# Patient Record
Sex: Male | Born: 1975 | Race: White | Hispanic: No | Marital: Married | Smoking: Never smoker
Health system: Southern US, Community
[De-identification: ages and names within clinical notes are randomized; demographics above are authoritative.]

## PROBLEM LIST (undated history)

## (undated) DIAGNOSIS — N390 Urinary tract infection, site not specified: Secondary | ICD-10-CM

## (undated) DIAGNOSIS — E785 Hyperlipidemia, unspecified: Secondary | ICD-10-CM

## (undated) HISTORY — PX: VASECTOMY: SHX75

## (undated) HISTORY — DX: Hyperlipidemia, unspecified: E78.5

## (undated) HISTORY — DX: Urinary tract infection, site not specified: N39.0

## (undated) HISTORY — PX: OTHER SURGICAL HISTORY: SHX169

---

## 2014-04-02 ENCOUNTER — Encounter: Payer: Self-pay | Admitting: Sports Medicine

## 2014-04-02 ENCOUNTER — Ambulatory Visit (INDEPENDENT_AMBULATORY_CARE_PROVIDER_SITE_OTHER): Payer: 59 | Admitting: Sports Medicine

## 2014-04-02 VITALS — BP 137/86 | Ht 75.0 in | Wt 190.0 lb

## 2014-04-02 DIAGNOSIS — S86812A Strain of other muscle(s) and tendon(s) at lower leg level, left leg, initial encounter: Secondary | ICD-10-CM

## 2014-04-02 DIAGNOSIS — S86119A Strain of other muscle(s) and tendon(s) of posterior muscle group at lower leg level, unspecified leg, initial encounter: Secondary | ICD-10-CM | POA: Insufficient documentation

## 2014-04-02 DIAGNOSIS — S86112A Strain of other muscle(s) and tendon(s) of posterior muscle group at lower leg level, left leg, initial encounter: Secondary | ICD-10-CM

## 2014-04-02 MED ORDER — NITROGLYCERIN 0.2 MG/HR TD PT24: MEDICATED_PATCH | TRANSDERMAL | Status: DC

## 2014-04-02 NOTE — Patient Instructions (Signed)
Nitroglycerin Protocol   Apply 1/4 nitroglycerin patch to affected area daily.  Change position of patch within the affected area every 24 hours.  You may experience a headache during the first 1-2 weeks of using the patch, these should subside.  If you experience headaches after beginning nitroglycerin patch treatment, you may take your preferred over the counter pain reliever.  Another side effect of the nitroglycerin patch is skin irritation or rash related to patch adhesive.  Please notify our office if you develop more severe headaches or rash, and stop the patch.  Tendon healing with nitroglycerin patch may require 12 to 24 weeks depending on the extent of injury.  Men should not use if taking Viagra, Cialis, or Levitra.   Do not use if you have migraines or rosacea.   -Continue compression sleeve when on feet for prolonged periods and when active -Continue heel lift in your shoe to protect the gastroc -Start double leg (can progress to single leg) heel raises on a step 15 reps x 3 sets, repeat with knees straight and knees bent -Follow-up in 6-8 weeks or sooner if needed

## 2014-04-02 NOTE — Assessment & Plan Note (Signed)
Left medial border of gastrocnemius tear with surrounding fluid -Continue compression sleeve when on feet for prolonged periods and when active -Start NTG protocol -Continue heel lift in your shoe to protect the gastroc -Start double leg (can progress to single leg) heel raises on a step 15 reps x 3 sets, repeat with knees straight and knees bent -Follow-up in 6-8 weeks or sooner if needed

## 2014-04-02 NOTE — Progress Notes (Signed)
   Subjective:    Patient ID: John Waters, male    DOB: Aug 14, 1975, 39 y.o.   MRN: [id number]  HPI John Waters is a 39yo male who presents with left calf pain after an injury while playing tennis on 03/11/14. He was cutting for a ball when he felt a sudden pain in the left calf. Since the injury he has rested and worn a body helix compression sleeve. His symptoms are improving. He denies any swelling, bruising, fever, or chills. He has not prior history of calf injury. 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 He is not taking any medication for pain.  Past medical history, social history, medications, and allergies were reviewed and are up to date in the chart. Review of Systems 7 point review of systems was performed and was otherwise negative unless noted in the history of present illness.    Objective:   Physical Exam BP 137/86 mmHg  Ht 6\' 3"  (1.905 m)  Wt 190 lb (86.183 kg)  BMI 23.75 kg/m2 GEN: The patient is well-developed well-nourished male and in no acute distress.  He is awake alert and oriented x3. SKIN: warm and well-perfused, no rash Neuro: Strength 5/5 globally. Sensation intact throughout. No focal deficits. Vasc: +2 bilateral distal pulses. No edema.  MSK: Grossly normal appearing left calf. Calves symetric. TTP at an area of medial-inferior border of the left gastrocnemius muscle belly. No overlying erythema, induration, or bruising. Achilles tendon intact by palpation. Negative Thompson. Pain reproduced with heel raise. No leg length discrepancy.  Limited MSK [place]: 1.25 x 0.8 area of hypoechoic disruption of medial border of left gastrocnemius muscle with small fluid collection inferiorally at musculotendinous junction. Achilles tendon appears fully intact without thickening.    Assessment & Plan:  Please see problem based assessment and plan in the problem list.

## 2014-05-06 ENCOUNTER — Ambulatory Visit (INDEPENDENT_AMBULATORY_CARE_PROVIDER_SITE_OTHER): Payer: 59 | Admitting: Family Medicine

## 2014-05-06 VITALS — BP 128/80 | HR 76 | Temp 100.5°F | Resp 20 | Ht 75.0 in | Wt 193.1 lb

## 2014-05-06 DIAGNOSIS — B349 Viral infection, unspecified: Secondary | ICD-10-CM

## 2014-05-06 DIAGNOSIS — R6889 Other general symptoms and signs: Secondary | ICD-10-CM

## 2014-05-06 DIAGNOSIS — R509 Fever, unspecified: Secondary | ICD-10-CM

## 2014-05-06 LAB — POCT RAPID STREP A (OFFICE): Rapid Strep A Screen: NEGATIVE

## 2014-05-06 LAB — POCT INFLUENZA A/B
Influenza A, POC: NEGATIVE
Influenza B, POC: NEGATIVE

## 2014-05-06 MED ORDER — ACETAMINOPHEN 325 MG PO TABS: 500.0000 mg | ORAL_TABLET | Freq: Once | ORAL | Status: DC

## 2014-05-06 MED ORDER — OSELTAMIVIR PHOSPHATE 75 MG PO CAPS: 75.0000 mg | ORAL_CAPSULE | Freq: Two times a day (BID) | ORAL | Status: DC

## 2014-05-06 NOTE — Progress Notes (Signed)
Chief Complaint:  Chief Complaint  Patient presents with  . Fever  . Nausea  . Chills  . Generalized Body Aches    HPI: John Waters is a 39 y.o. male who is here for 1 day history of msk aches, nausea, chills and body aches, he has had low grade temp and has been feeling bad all around.  HAs had flu vaccine, some nausea and upset stomach since has not really eaten. He is on 1/2 does of nitro for calf injury possible tear. He has no other complaints. Minimal congestion in the face. No cough.  He has children, one child also has a fever. No rashes, no ear pain, has blown some congestion from his nose. He has had it for 24 hours. Has not taken anything for it, has a diffuse headache, no beck rigidity or pain.   History reviewed. No pertinent past medical history. History reviewed. No pertinent past surgical history. History   Social History  . Marital Status: Single    Spouse Name: N/A    Number of Children: N/A  . Years of Education: N/A   Social History Main Topics  . Smoking status: Never Smoker   . Smokeless tobacco: Never Used  . Alcohol Use: 0.0 oz/week    0 Not specified per week     Waters: rare  . Drug Use: No  . Sexual Activity: None   Other Topics Concern  . None   Social History Narrative  . None   Family History  Problem Relation Age of Onset  . Heart disease Father    No Known Allergies Prior to Admission medications   Medication Sig Start Date End Date Taking? Authorizing Provider  nitroGLYCERIN (NITRODUR - DOSED IN MG/24 HR) 0.1 mg/hr patch Apply 1/4 patch on left calf daily   Yes Historical Provider, MD     ROS: The patient denies  night sweats, unintentional weight loss, chest pain, palpitations, wheezing, dyspnea on exertion, vomiting, abdominal pain, dysuria, hematuria, melena, numbness,  or tingling.   All other systems have been reviewed and were otherwise negative with the exception of those mentioned in the HPI and as above.     PHYSICAL EXAM: Filed Vitals:   05/06/14 1803  BP: 128/80  Pulse: 76  Temp: 100.5 F (38.1 C)  Resp: 20   Filed Vitals:   05/06/14 1803  Height: 6\' 3"  (1.905 m)  Weight: 193 lb 2 oz (87.601 kg)   Body mass index is 24.14 kg/(m^2).  General: Alert, no acute distress HEENT:  Normocephalic, atraumatic, oropharynx patent. EOMI, PERRLA, tonsils nl + TM right side one streak of blood on TM, TM is still intact,  without infection, nontender sinuses, he has some broken cappilaries in the right nasal mucosa.  Cardiovascular:  Regular rate and rhythm, no rubs murmurs or gallops. No pedal edema.  Respiratory: Clear to auscultation bilaterally.  No wheezes, rales, or rhonchi.  No cyanosis, no use of accessory musculature GI: No organomegaly, abdomen is soft and non-tender, positive bowel sounds.  No masses. Skin: No rashes. Neurologic: Facial musculature symmetric. Psychiatric: Patient is appropriate throughout our interaction. Lymphatic: No cervical lymphadenopathy Musculoskeletal: Gait intact.   LABS: Results for orders placed or performed in visit on 05/06/14  POCT Influenza A/B  Result Value Ref Range   Influenza A, POC Negative    Influenza B, POC Negative   POCT rapid strep A  Result Value Ref Range   Rapid Strep A Screen Negative  Negative     EKG/XRAY:   Primary read interpreted by Dr. Marin Waters at [facility].   ASSESSMENT/PLAN: Encounter Diagnoses  Name Primary?  . Fever, unspecified fever cause   . Flu-like symptoms Yes  . 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  Fu prn    Gross sideeffects, risk and benefits, and  alternatives of medications d/w patient. Patient is aware that all medications have potential sideeffects and we are unable to predict every sideeffect or drug-drug interaction that may occur.  LE, Lyons, DO 05/06/2014 6:47 PM

## 2014-05-09 ENCOUNTER — Encounter: Payer: Self-pay | Admitting: Sports Medicine

## 2014-05-14 ENCOUNTER — Ambulatory Visit: Payer: 59 | Admitting: Sports Medicine

## 2014-05-16 ENCOUNTER — Encounter: Payer: Self-pay | Admitting: Sports Medicine

## 2014-05-16 ENCOUNTER — Ambulatory Visit (INDEPENDENT_AMBULATORY_CARE_PROVIDER_SITE_OTHER): Payer: 59 | Admitting: Sports Medicine

## 2014-05-16 VITALS — BP 118/79 | Ht 75.0 in | Wt 190.0 lb

## 2014-05-16 DIAGNOSIS — S86812D Strain of other muscle(s) and tendon(s) at lower leg level, left leg, subsequent encounter: Secondary | ICD-10-CM

## 2014-05-16 DIAGNOSIS — S86112D Strain of other muscle(s) and tendon(s) of posterior muscle group at lower leg level, left leg, subsequent encounter: Secondary | ICD-10-CM

## 2014-05-19 NOTE — Progress Notes (Signed)
  John Waters - 39 y.o. male MRN [id number]  Date of birth: 06-May-1975  SUBJECTIVE: CC: Left calf pain, follow-up HPI: Overall doing better  reporting tolerating activities including a Marathon relay this past weekend with only mild soreness.  He has not returned to tennis  Tolerating nitroglycerin well without difficulty  Wearing a body helix and performing home exercises on an almost daily basis  still guarding his leg and concerned that any explosive movement may reaggravat it  Able to function throughout the day without difficulty  ROS:  denies any numbness or tingling, denies worsening pain out of proportion  HISTORY:  Past Medical, Surgical, Social, and Family History reviewed & updated per EMR.  Pertinent Historical Findings include:  reports that he has never smoked. He has never used smokeless tobacco.  OBJECTIVE:  VS:   HT:6\' 3"  (190.5 cm)   WT:190 lb (86.183 kg)  BMI:23.8          BP:118/79 mmHg  HR: bpm  TEMP: ( )  RESP:   PHYSICAL EXAM:  GENERAL: Adult, well-built, athletic male. No acute distress PSYCH: Alert and appropriately interactive. VASCULAR: No lower extremity pretibial edema  NEURO: Sensation intact to light touch  LEFT LEG: Overall symmetric appearing, no significant deformity, plantar flexion strength 5/5  DATA OBTAINED DURING VISIT: LIMITED MSK ULTRASOUND OF LEFT LEG: Findings:  Large 4 x 5 cm heterogeneous area at the musculotendinous junction of the medial head of the gastroc. Persistent neovascularity with no compressible component. Impression: Resolving medial head of the gastroc tear with complex, resolving hematoma   ASSESSMENT: 1. Gastrocnemius muscle rupture, left, subsequent encounter     PLAN: See problem based charting & AVS for additional documentation.  Continue eccentric Alfredson's exercises  Continue Nitroglycerin protocol  Continue compression sleeve  Discussed would avoid competitive tennis until reevaluated in 6  weeks, okay for social doubles  > Return in about 6 weeks (around 06/27/2014) for repeat ultrasound.

## 2014-07-02 ENCOUNTER — Ambulatory Visit (INDEPENDENT_AMBULATORY_CARE_PROVIDER_SITE_OTHER): Payer: 59 | Admitting: Sports Medicine

## 2014-07-02 ENCOUNTER — Encounter: Payer: Self-pay | Admitting: Sports Medicine

## 2014-07-02 VITALS — BP 132/84 | Ht 75.0 in | Wt 190.0 lb

## 2014-07-02 DIAGNOSIS — S86112D Strain of other muscle(s) and tendon(s) of posterior muscle group at lower leg level, left leg, subsequent encounter: Secondary | ICD-10-CM

## 2014-07-02 DIAGNOSIS — S86812D Strain of other muscle(s) and tendon(s) at lower leg level, left leg, subsequent encounter: Secondary | ICD-10-CM | POA: Diagnosis not present

## 2014-07-02 NOTE — Progress Notes (Signed)
Patient ID: John Waters, male   DOB: Nov 29, 1975, 39 y.o.   MRN: [id number]  He is 3 months after diagnosis of a tear of the left medial gastrocnemius muscle We are using calf compression\ nitroglycerin protocol for which he is somewhat inconsistent/ heel lifts He can do 3 sets of 15 unilateral leg raises Pain is minimal He has returned to most normal activities and some doubles tennis He still feels cautious about sprinting  Exam NAD BP 132/84 mmHg  Ht 6\' 3"  (1.905 m)  Wt 190 lb (86.183 kg)  BMI 23.75 kg/m2  There is no noticeable swelling or discoloration Left medial calf is nontender to palpation Good muscle definition  Walking with no limp  Ultrasound Her previous scan hematoma and swelling covered about 40% of the muscle of the medial gastroc This area appears to have less hypoechoic change and more normal muscle fiber There is only a small area that still looks hypoechoic

## 2014-07-02 NOTE — Assessment & Plan Note (Signed)
Clinically he is at least 90% improved  I would suggest continuing to use calf compression and heel lifts for the next 3 months Keep up home exercises at least 3 times a week  Nitroglycerin can be used up to another 3 months  Recheck if problems recur or he gets swelling or pain

## 2015-01-13 ENCOUNTER — Ambulatory Visit (INDEPENDENT_AMBULATORY_CARE_PROVIDER_SITE_OTHER): Payer: 59 | Admitting: Sports Medicine

## 2015-01-13 ENCOUNTER — Encounter: Payer: Self-pay | Admitting: Sports Medicine

## 2015-01-13 VITALS — BP 133/85 | Ht 75.0 in | Wt 190.0 lb

## 2015-01-13 DIAGNOSIS — G5631 Lesion of radial nerve, right upper limb: Secondary | ICD-10-CM | POA: Diagnosis not present

## 2015-01-13 DIAGNOSIS — M542 Cervicalgia: Secondary | ICD-10-CM | POA: Diagnosis not present

## 2015-01-13 NOTE — Assessment & Plan Note (Signed)
Isolated radial neuropathy causing triceps weakness has been reported in sports but is rare He does not show any signs of metabolic or amyotrophic disease  Prior cases that I have seen or suspected to be a neuropraxia related to bench pressing and possibly a neuropraxia to injury of the brachial plexus  X-ray of the cervical spine to rule out any spurring or congenital anomaly I don't think an MRI would be helpful at this point We will give him vitamin B6 100 per day  Light Weight rehabilitation of the triceps  This is fairly early in the process would not do I nerve conduction or EMG yet  If this is not resolving in a reasonable timeframe we will need to do more evaluation  I will recheck him within one month

## 2015-01-13 NOTE — Progress Notes (Signed)
Patient ID: John Waters, male   DOB: [birth year], 39 y.o.   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 This started about one month ago with some inability to control his forehand in tennis He did not recall a specific injury He noted shortly after that he wasn't able to do as many pushups At the gym he tried some triceps exercises and found that the right was very weak compared to the left  He does recall doing bench presses with fairly heavy weight 1 day and felt this could have aggravated the symptoms He did not have any pain He did not have any numbness over the upper arm No radicular symptoms into the lower arm  no weakness with grip or hand movements  Past medical history is not significant for hypothyroidism or diabetes  Review of systems No recent fever chills or medication use No history of significant viral infection in the past few months No muscle fasciculations  Physical exam Physically fit white male who is muscular and in no acute distress BP 133/85 mmHg  Ht 6\' 3"  (1.905 m)  Wt 190 lb (86.183 kg)  BMI 23.75 kg/m2  Neck examination Full range of motion without pain or radicular symptoms Spurling's test is negative Lateral bend with pressure causes some dull symptoms in the right shoulder Lateral bend to the ipsilateral side causes no pain Triceps reflex on the right is trace to 1+ but is equal to the left Biceps reflexes normal  Strength testing of the right upper extremity; moderate weakness of the right triceps 4/5; biceps and forearm muscles were normal/ interosseous muscles of the hand were strong   RT Shoulder exam  Inspection reveals no abnormalities, atrophy or asymmetry. Palpation is normal with no tenderness over AC joint or bicipital groove. ROM is full in all planes. Rotator cuff strength normal throughout. No signs of impingement with negative Neer and Hawkin's tests, empty can. Speeds and Yergason's  tests normal. No labral pathology noted with negative Obrien's, negative clunk and good stability. Normal scapular function observed. No painful arc and no drop arm sign. No apprehension sign  Upper arm - no sensory change noted  No skin lesions

## 2015-01-21 ENCOUNTER — Ambulatory Visit
Admission: RE | Admit: 2015-01-21 | Discharge: 2015-01-21 | Disposition: A | Discharge status: Home / Self Care | Payer: 59 | Source: Ambulatory Visit | Attending: Sports Medicine | Admitting: Sports Medicine

## 2015-01-21 DIAGNOSIS — M542 Cervicalgia: Secondary | ICD-10-CM

## 2015-04-01 ENCOUNTER — Encounter: Payer: Self-pay | Admitting: Family Medicine

## 2015-04-01 ENCOUNTER — Ambulatory Visit (INDEPENDENT_AMBULATORY_CARE_PROVIDER_SITE_OTHER): Payer: 59 | Admitting: Family Medicine

## 2015-04-01 VITALS — BP 120/80 | Ht 75.0 in | Wt 190.0 lb

## 2015-04-01 DIAGNOSIS — M7711 Lateral epicondylitis, right elbow: Secondary | ICD-10-CM

## 2015-04-01 NOTE — Assessment & Plan Note (Signed)
Right lateral epicondylitis. -Counterforce brace, ice, nitroglycerin patch and eccentric exercises. -Follow-up in 4-8 weeks to see how he is doing. -Can consider PRP/cortisone injection with percutaneous tenotomy if no improvement at that time

## 2015-04-01 NOTE — Progress Notes (Signed)
  John Waters - 40 y.o. male MRN [id number]  Date of birth: 27-Jul-1975 John Waters is a 40 y.o. male who presents today for right elbow pain lateral.  Right elbow pain, initial visit-patient pain is located on the lateral aspect of his elbow. This is been ongoing now for 3-4 weeks after playing extensive amount of tenderness. Pain has subsided somewhat after taking a 2 to three-week hiatus. He tried a counterforce brace for the past week which is also helped. Has not had a previously injected or done exercises this time. No paresthesias going distally into his hand he denies any pain 3-5 cm distal to the lateral epicondyle. Right hand dominant and no previous injury.  PMHx - Updated and reviewed.  Contributory factors include: Noncontributory PSHx - Updated and reviewed.  Contributory factors include:  Noncontributory FHx - Updated and reviewed.  Contributory factors include:  Noncontributory Medications - updated and reviewed.   ROS Per HPI.  12 point negative other than per HPI.   Exam:  Filed Vitals:   04/01/15 1601  BP: 120/80   Gen: NAD, AAO 3 Cardiorespiratory - Normal respiratory effort/rate.  RRR Skin: No rashes or erythema Extremities: No edema, pulses +2 bilateral upper and lower extremity  R elbow - No erythema or increased carrying angle.  TTP lateral epicondyle.  Worse with passive wrist flexion and active wrist extension/dorsiflexion.  ROM nml in all planes. Neurovascularly intact RUE

## 2015-12-07 ENCOUNTER — Other Ambulatory Visit: Payer: Self-pay | Admitting: *Deleted

## 2015-12-07 ENCOUNTER — Encounter (HOSPITAL_COMMUNITY): Payer: Self-pay | Admitting: Emergency Medicine

## 2015-12-07 ENCOUNTER — Ambulatory Visit (HOSPITAL_COMMUNITY)
Admission: RE | Admit: 2015-12-07 | Discharge: 2015-12-07 | Disposition: A | Discharge status: Home / Self Care | Payer: 59 | Source: Ambulatory Visit | Attending: Internal Medicine | Admitting: Internal Medicine

## 2015-12-07 ENCOUNTER — Other Ambulatory Visit (HOSPITAL_COMMUNITY): Payer: Self-pay | Admitting: *Deleted

## 2015-12-07 ENCOUNTER — Emergency Department (HOSPITAL_COMMUNITY)
Admission: EM | Admit: 2015-12-07 | Discharge: 2015-12-07 | Disposition: A | Discharge status: Home / Self Care | Payer: 59 | Attending: Emergency Medicine | Admitting: Emergency Medicine

## 2015-12-07 DIAGNOSIS — R300 Dysuria: Secondary | ICD-10-CM

## 2015-12-07 DIAGNOSIS — N390 Urinary tract infection, site not specified: Secondary | ICD-10-CM | POA: Diagnosis not present

## 2015-12-07 DIAGNOSIS — M545 Low back pain: Secondary | ICD-10-CM | POA: Diagnosis not present

## 2015-12-07 LAB — URINE MICROSCOPIC-ADD ON

## 2015-12-07 LAB — CBC WITH DIFFERENTIAL/PLATELET
BASOS ABS: 0 10*3/uL (ref 0.0–0.1)
Basophils Relative: 0 %
Eosinophils Absolute: 0 10*3/uL (ref 0.0–0.7)
Eosinophils Relative: 0 %
HEMATOCRIT: 44.7 % (ref 39.0–52.0)
Hemoglobin: 15.1 g/dL (ref 13.0–17.0)
LYMPHS ABS: 0.5 10*3/uL — AB (ref 0.7–4.0)
LYMPHS PCT: 3 %
MCH: 29.4 pg (ref 26.0–34.0)
MCHC: 33.8 g/dL (ref 30.0–36.0)
MCV: 87.1 fL (ref 78.0–100.0)
MONO ABS: 1.7 10*3/uL — AB (ref 0.1–1.0)
Monocytes Relative: 10 %
NEUTROS ABS: 14.3 10*3/uL — AB (ref 1.7–7.7)
Neutrophils Relative %: 87 %
Platelets: 203 10*3/uL (ref 150–400)
RBC: 5.13 MIL/uL (ref 4.22–5.81)
RDW: 13.1 % (ref 11.5–15.5)
WBC: 16.7 10*3/uL — AB (ref 4.0–10.5)

## 2015-12-07 LAB — COMPREHENSIVE METABOLIC PANEL
ALT: 20 U/L (ref 17–63)
AST: 30 U/L (ref 15–41)
Albumin: 4.2 g/dL (ref 3.5–5.0)
Alkaline Phosphatase: 55 U/L (ref 38–126)
Anion gap: 13 (ref 5–15)
BILIRUBIN TOTAL: 1.4 mg/dL — AB (ref 0.3–1.2)
BUN: 17 mg/dL (ref 6–20)
CALCIUM: 9.5 mg/dL (ref 8.9–10.3)
CO2: 22 mmol/L (ref 22–32)
CREATININE: 1.19 mg/dL (ref 0.61–1.24)
Chloride: 103 mmol/L (ref 101–111)
GFR calc Af Amer: >60 mL/min (ref >60)
Glucose, Bld: 150 mg/dL — ABNORMAL HIGH (ref 65–99)
Potassium: 3.8 mmol/L (ref 3.5–5.1)
Sodium: 138 mmol/L (ref 135–145)
TOTAL PROTEIN: 7 g/dL (ref 6.5–8.1)

## 2015-12-07 LAB — URINALYSIS, ROUTINE W REFLEX MICROSCOPIC
BILIRUBIN URINE: NEGATIVE
Glucose, UA: NEGATIVE mg/dL
Ketones, ur: NEGATIVE mg/dL
Nitrite: NEGATIVE
PH: 8 (ref 5.0–8.0)
Protein, ur: 30 mg/dL — AB
Specific Gravity, Urine: 1.027 (ref 1.005–1.030)

## 2015-12-07 LAB — POC OCCULT BLOOD, ED: Fecal Occult Bld: NEGATIVE

## 2015-12-07 LAB — LIPASE, BLOOD: LIPASE: 23 U/L (ref 11–51)

## 2015-12-07 MED ORDER — SODIUM CHLORIDE 0.9 % IV BOLUS (SEPSIS)
1000.0000 mL | Freq: Once | INTRAVENOUS | Status: AC
  Administered 2015-12-07: 1000 mL via INTRAVENOUS

## 2015-12-07 MED ORDER — DEXTROSE 5 % IV SOLN
1.0000 g | INTRAVENOUS | Status: DC
  Administered 2015-12-07: 1 g via INTRAVENOUS
  Filled 2015-12-07: qty 10

## 2015-12-07 MED ORDER — ONDANSETRON HCL 8 MG PO TABS: 8.0000 mg | ORAL_TABLET | Freq: Three times a day (TID) | ORAL | 1 refills | Status: DC | PRN

## 2015-12-07 MED ORDER — SULFAMETHOXAZOLE-TRIMETHOPRIM 800-160 MG PO TABS: 1.0000 | ORAL_TABLET | Freq: Two times a day (BID) | ORAL | 1 refills | Status: DC

## 2015-12-07 MED ORDER — ACETAMINOPHEN 500 MG PO TABS
1000.0000 mg | ORAL_TABLET | Freq: Once | ORAL | Status: AC
  Administered 2015-12-07: 1000 mg via ORAL
  Filled 2015-12-07: qty 2

## 2015-12-07 MED ORDER — ONDANSETRON HCL 4 MG/2ML IJ SOLN: 4.0000 mg | Freq: Once | INTRAMUSCULAR | Status: DC

## 2015-12-07 MED FILL — SULFAMETHOXAZOLE/TMP DS TAB: 800-160 | 30 days supply | Qty: 60 | Fill #0

## 2015-12-07 NOTE — ED Triage Notes (Signed)
Pt here with low back pain, dysuria and fevers starting this morning. Pt reports dizziness when standing earlier today. Pt had a UA done at his office today that he reports ws positive.

## 2015-12-07 NOTE — Discharge Instructions (Signed)
Increase fluids. Tylenol for fever. Bactrim twice a day for 14 days. Prescription for Zofran 8 mg. Return if worse.

## 2015-12-07 NOTE — ED Provider Notes (Signed)
[facility] DEPT Provider Note   CSN: [id number] Arrival date & time: 12/07/15  1324     History   Chief Complaint Chief Complaint  Patient presents with  . Fever  . Urinary Frequency  . Back Pain    HPI John Waters is a 40 y.o. male.  Dysuria, low back pain, fever since this morning. He is normally healthy. No history of UTIs. No urological history. Severity of symptoms is moderate. Nothing makes symptoms better or worse.      History reviewed. No pertinent past medical history.  Patient Active Problem List   Diagnosis Date Noted  . Lateral epicondylitis of right elbow 04/01/2015  . 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 Gastrocnemius muscle rupture 04/02/2014    History reviewed. No pertinent surgical history.     Home Medications    Prior to Admission medications   Medication Sig Start Date End Date Taking? Authorizing Provider  acetaminophen (TYLENOL) 500 MG tablet Take 500 mg by mouth every 6 (six) hours as needed for mild pain.   Yes Historical Provider, MD  sulfamethoxazole-trimethoprim (BACTRIM DS) 800-160 MG tablet Take 1 tablet by mouth 2 (two) times daily. With food 12/07/15  Yes Chipper Herb, MD  ondansetron (ZOFRAN) 8 MG tablet Take 1 tablet (8 mg total) by mouth every 8 (eight) hours as needed for nausea or vomiting. 12/07/15   Nat Christen, MD  oseltamivir (TAMIFLU) 75 MG capsule Take 1 capsule (75 mg total) by mouth 2 (two) times daily. Patient not taking: Reported on 12/07/2015 05/06/14   Glenford Bayley, DO    Family History Family History  Problem Relation Age of Onset  . Heart disease Father     Social History Social History  Substance Use Topics  . Smoking status: Never Smoker  . Smokeless tobacco: Never Used  . Alcohol use 0.0 oz/week     Comment: rare     Allergies   Review of patient's allergies indicates no known allergies.   Review of Systems Review of Systems  All other systems reviewed and are  negative.    Physical Exam Updated Vital Signs BP 127/78   Pulse 78   Temp 99.1 F (37.3 C) (Oral)   Resp 18   Ht 6\' 3"  (1.905 m)   Wt 190 lb (86.2 kg)   SpO2 99%   BMI 23.75 kg/m   Physical Exam  Constitutional: He is oriented to person, place, and time. He appears well-developed and well-nourished.  HENT:  Head: Normocephalic and atraumatic.  Eyes: Conjunctivae are normal.  Neck: Neck supple.  Cardiovascular: Normal rate and regular rhythm.   Pulmonary/Chest: Effort normal and breath sounds normal.  Abdominal: Soft. Bowel sounds are normal.  Genitourinary:  Genitourinary Comments: Rectal exam: Prostate nontender. No masses. Heme negative.  Musculoskeletal:  Tender in the lower back.  Neurological: He is alert and oriented to person, place, and time.  Skin: Skin is warm and dry.  Psychiatric: He has a normal mood and affect. His behavior is normal.  Nursing note and vitals reviewed.    ED Treatments / Results  Labs (all labs ordered are listed, but only abnormal results are displayed) Labs Reviewed  CBC WITH DIFFERENTIAL/PLATELET - Abnormal; Notable for the following:       Result Value   WBC 16.7 (*)    Neutro Abs 14.3 (*)    Lymphs Abs 0.5 (*)    Monocytes Absolute 1.7 (*)    All other components within normal limits  COMPREHENSIVE METABOLIC PANEL - Abnormal; Notable for the following:    Glucose, Bld 150 (*)    Total Bilirubin 1.4 (*)    All other components within normal limits  CULTURE, BLOOD (ROUTINE X 2)  CULTURE, BLOOD (ROUTINE X 2)  LIPASE, BLOOD  POC OCCULT BLOOD, ED    EKG  EKG Interpretation None       Radiology No results found.  Procedures Procedures (including critical care time)  Medications Ordered in ED Medications  cefTRIAXone (ROCEPHIN) 1 g in dextrose 5 % 50 mL IVPB (1 g Intravenous New Bag/Given 12/07/15 1517)  ondansetron (ZOFRAN) injection 4 mg (not administered)  sodium chloride 0.9 % bolus 1,000 mL (0 mLs Intravenous  Stopped 12/07/15 1433)  sodium chloride 0.9 % bolus 1,000 mL (0 mLs Intravenous Stopped 12/07/15 1520)  acetaminophen (TYLENOL) tablet 1,000 mg (1,000 mg Oral Given 12/07/15 1358)     Initial Impression / Assessment and Plan / ED Course  I have reviewed the triage vital signs and the nursing notes.  Pertinent labs & imaging results that were available during my care of the patient were reviewed by me and considered in my medical decision making (see chart for details).  Clinical Course    IV fluids, Rocephin 1 g IV, urine culture.  Discharge medications Bactrim DS twice a day and Zofran 8 mg.  Final Clinical Impressions(s) / ED Diagnoses   Final diagnoses:  UTI (lower urinary tract infection)    New Prescriptions New Prescriptions   ONDANSETRON (ZOFRAN) 8 MG TABLET    Take 1 tablet (8 mg total) by mouth every 8 (eight) hours as needed for nausea or vomiting.     Nat Christen, MD 12/07/15 [phone]

## 2015-12-09 LAB — URINE CULTURE: Culture: >=100000 — AB

## 2015-12-11 ENCOUNTER — Other Ambulatory Visit (HOSPITAL_COMMUNITY): Payer: 59

## 2015-12-12 LAB — CULTURE, BLOOD (ROUTINE X 2)
Culture: NO GROWTH
Culture: NO GROWTH

## 2015-12-15 DIAGNOSIS — R300 Dysuria: Secondary | ICD-10-CM | POA: Diagnosis not present

## 2015-12-15 DIAGNOSIS — R350 Frequency of micturition: Secondary | ICD-10-CM | POA: Diagnosis not present

## 2015-12-31 DIAGNOSIS — H52223 Regular astigmatism, bilateral: Secondary | ICD-10-CM | POA: Diagnosis not present

## 2015-12-31 DIAGNOSIS — H5213 Myopia, bilateral: Secondary | ICD-10-CM | POA: Diagnosis not present

## 2016-07-13 ENCOUNTER — Other Ambulatory Visit: Payer: Self-pay | Admitting: Oncology

## 2016-07-13 DIAGNOSIS — N6012 Diffuse cystic mastopathy of left breast: Secondary | ICD-10-CM | POA: Diagnosis not present

## 2016-09-23 ENCOUNTER — Encounter: Payer: Self-pay | Admitting: Family Medicine

## 2016-09-23 ENCOUNTER — Ambulatory Visit (INDEPENDENT_AMBULATORY_CARE_PROVIDER_SITE_OTHER): Payer: 59 | Admitting: Family Medicine

## 2016-09-23 VITALS — BP 124/76 | HR 56 | Temp 98.2°F | Ht 75.5 in | Wt 194.8 lb

## 2016-09-23 DIAGNOSIS — E785 Hyperlipidemia, unspecified: Secondary | ICD-10-CM

## 2016-09-23 DIAGNOSIS — Z23 Encounter for immunization: Secondary | ICD-10-CM

## 2016-09-23 DIAGNOSIS — Z0000 Encounter for general adult medical examination without abnormal findings: Secondary | ICD-10-CM

## 2016-09-23 DIAGNOSIS — Z8349 Family history of other endocrine, nutritional and metabolic diseases: Secondary | ICD-10-CM

## 2016-09-23 DIAGNOSIS — Z83438 Family history of other disorder of lipoprotein metabolism and other lipidemia: Secondary | ICD-10-CM

## 2016-09-23 NOTE — Assessment & Plan Note (Addendum)
likely will prescribe crestor 5mg . LDL 170 in 2016 and strong family history CAD in mom and dad

## 2016-09-23 NOTE — Progress Notes (Signed)
Phone: [phone]  Subjective:  Patient presents today for their annual physical. Chief complaint-noted.   See problem oriented charting- ROS- full  review of systems was completed and negative including No chest pain or shortness of breath. No headache or blurry vision.   The following were reviewed and entered/updated in epic: Past Medical History:  Diagnosis Date  . Hyperlipidemia    likely crestor 5mg . LDL 170 in 2016 and strong family history CAD in mom and dad  . UTI (urinary tract infection)    ER visit. E. Coli. Saw urology- no further workup.    Patient Active Problem List   Diagnosis Date Noted  . Hyperlipidemia   . Lateral epicondylitis of right elbow 04/01/2015  . 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 none      Family History  Problem Relation Age of Onset  . Heart disease Mother        54s. also mitral valve repair  . Hyperlipidemia Mother   . Breast cancer Mother   . Heart disease Father   . Hyperlipidemia Father     Medications- reviewed and updated Current Outpatient Prescriptions  Medication Sig Dispense Refill  . acetaminophen (TYLENOL) 500 MG tablet Take 500 mg by mouth every 6 (six) hours as needed for mild pain.     No current facility-administered medications for this visit.     Allergies-reviewed and updated No Known Allergies  Social History   Social History  . Marital status: Single    Spouse name: N/A  . Number of children: N/A  . Years of education: N/A   Social History Main Topics  . Smoking status: Never Smoker  . Smokeless tobacco: Never Used  . Alcohol use 0.0 - 1.2 oz/week     Comment: rare  . Drug use: No  . Sexual activity: Yes   Other Topics Concern  . 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       Enjoys exercising, time with family    Objective: BP 124/76 (BP Location: Left Arm, Patient Position: Sitting, Cuff Size: Large)   Pulse (!) 56   Temp 98.2 F (36.8 C) (Oral)   Ht 6' 3.5" (1.918 m)   Wt 194 lb 12.8 oz (88.4 kg)   SpO2 96%   BMI 24.03 kg/m  Gen: NAD, resting comfortably HEENT: Mucous membranes are moist. Oropharynx normal Neck: no thyromegaly CV: RRR no murmurs rubs or gallops Lungs: CTAB no crackles, wheeze, rhonchi Abdomen: soft/nontender/nondistended/normal bowel sounds. No rebound or guarding.  Ext: no edema Skin: warm, dry Neuro: grossly normal, moves all extremities, PERRLA   Assessment/Plan:  41 y.o. male presenting for annual physical.  Health Maintenance counseling: 1. Anticipatory guidance: Patient counseled regarding regular dental exams -q6 months, eye exams - yearly, wearing seatbelts.  2. Risk factor reduction:  Advised patient of need for regular exercise and diet rich and fruits and vegetables to reduce risk of heart attack and stroke. Exercise- excellent. Diet-clean for most part- could improve.  Wt Readings from Last 3 Encounters:  09/23/16 194 lb 12.8 oz (88.4 kg)  12/07/15 190 lb (86.2 kg)  04/01/15 190 lb (86.2 kg)  3. Immunizations/screenings/ancillary studies- Tdap today.  HIV declined 4. Prostate cancer screening- no family history, start at age 80  5. Colon cancer screening - no family history, start at age 41 82 6. Skin cancer screening/prevention- no concerning lesions- relatively good with sunscreen 7. Testicular cancer screening- advised monthly self exams  8. STD screening- patient opts out- monogomous  Status of chronic or acute concerns   Hyperlipidemia likely will prescribe crestor 5mg . LDL 170 in 2016 and strong family history CAD in mom and dad  Orders Placed This Encounter  Procedures  . Tdap vaccine greater than or equal  to 7yo IM  . CBC    Standing Status:   Future    Standing Expiration Date:   09/23/2017  . Comprehensive metabolic panel    [place]    Standing Status:   Future    Standing Expiration Date:   09/23/2017  . Lipid panel    Standing Status:   Future    Standing Expiration Date:   09/23/2017   Return precautions advised.  [name], MD

## 2016-09-23 NOTE — Patient Instructions (Addendum)
Tdap today  Have them send me a copy of labs if they cant release the ones we ordered. If LDL above 130 will trial crestor 5mg 

## 2017-02-10 DIAGNOSIS — H524 Presbyopia: Secondary | ICD-10-CM | POA: Diagnosis not present

## 2017-02-10 DIAGNOSIS — H52223 Regular astigmatism, bilateral: Secondary | ICD-10-CM | POA: Diagnosis not present

## 2017-02-10 DIAGNOSIS — H5213 Myopia, bilateral: Secondary | ICD-10-CM | POA: Diagnosis not present

## 2017-03-07 IMAGING — CR DG CERVICAL SPINE 2 OR 3 VIEWS
3 series · 3 of 3 positions shown · non-contrast
Comparison: None.

CLINICAL DATA: Right arm pain for 1 month.  No known injury.

EXAM:
CERVICAL SPINE - 2-3 VIEW

[view not recorded (1 of 3)]
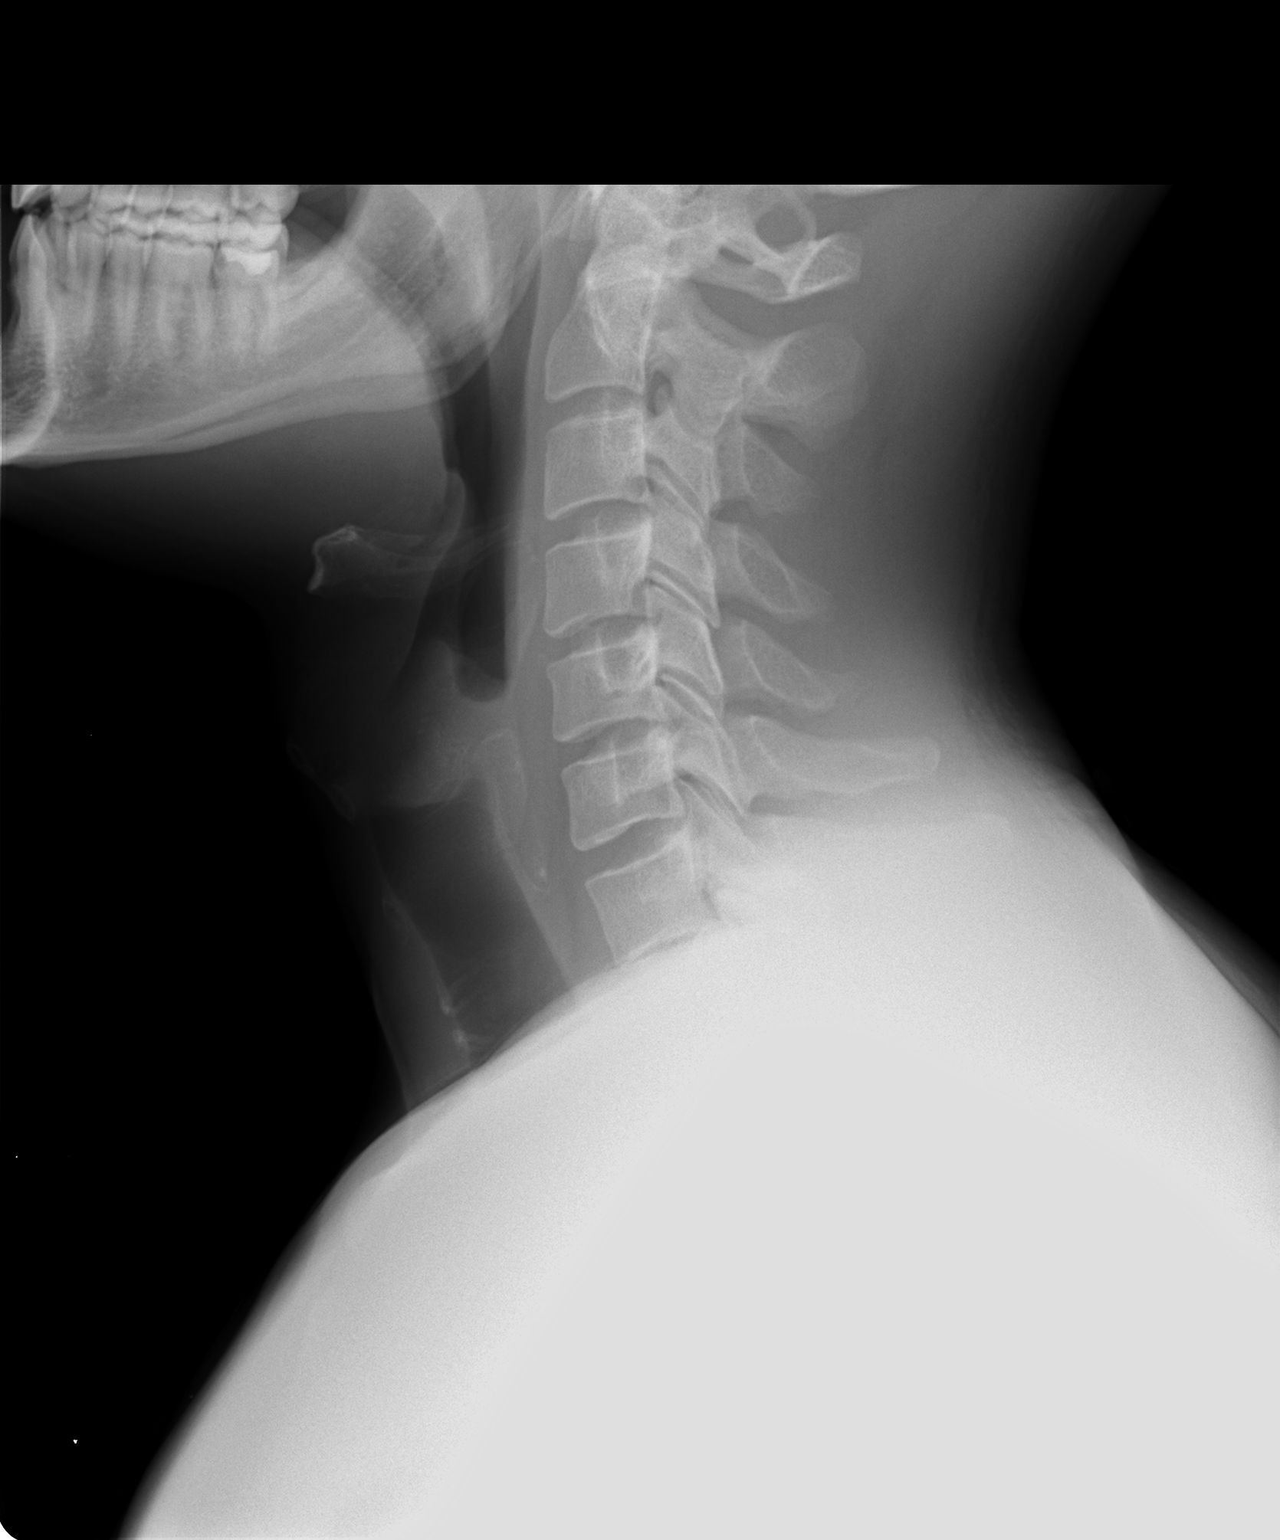

[view not recorded (2 of 3)]
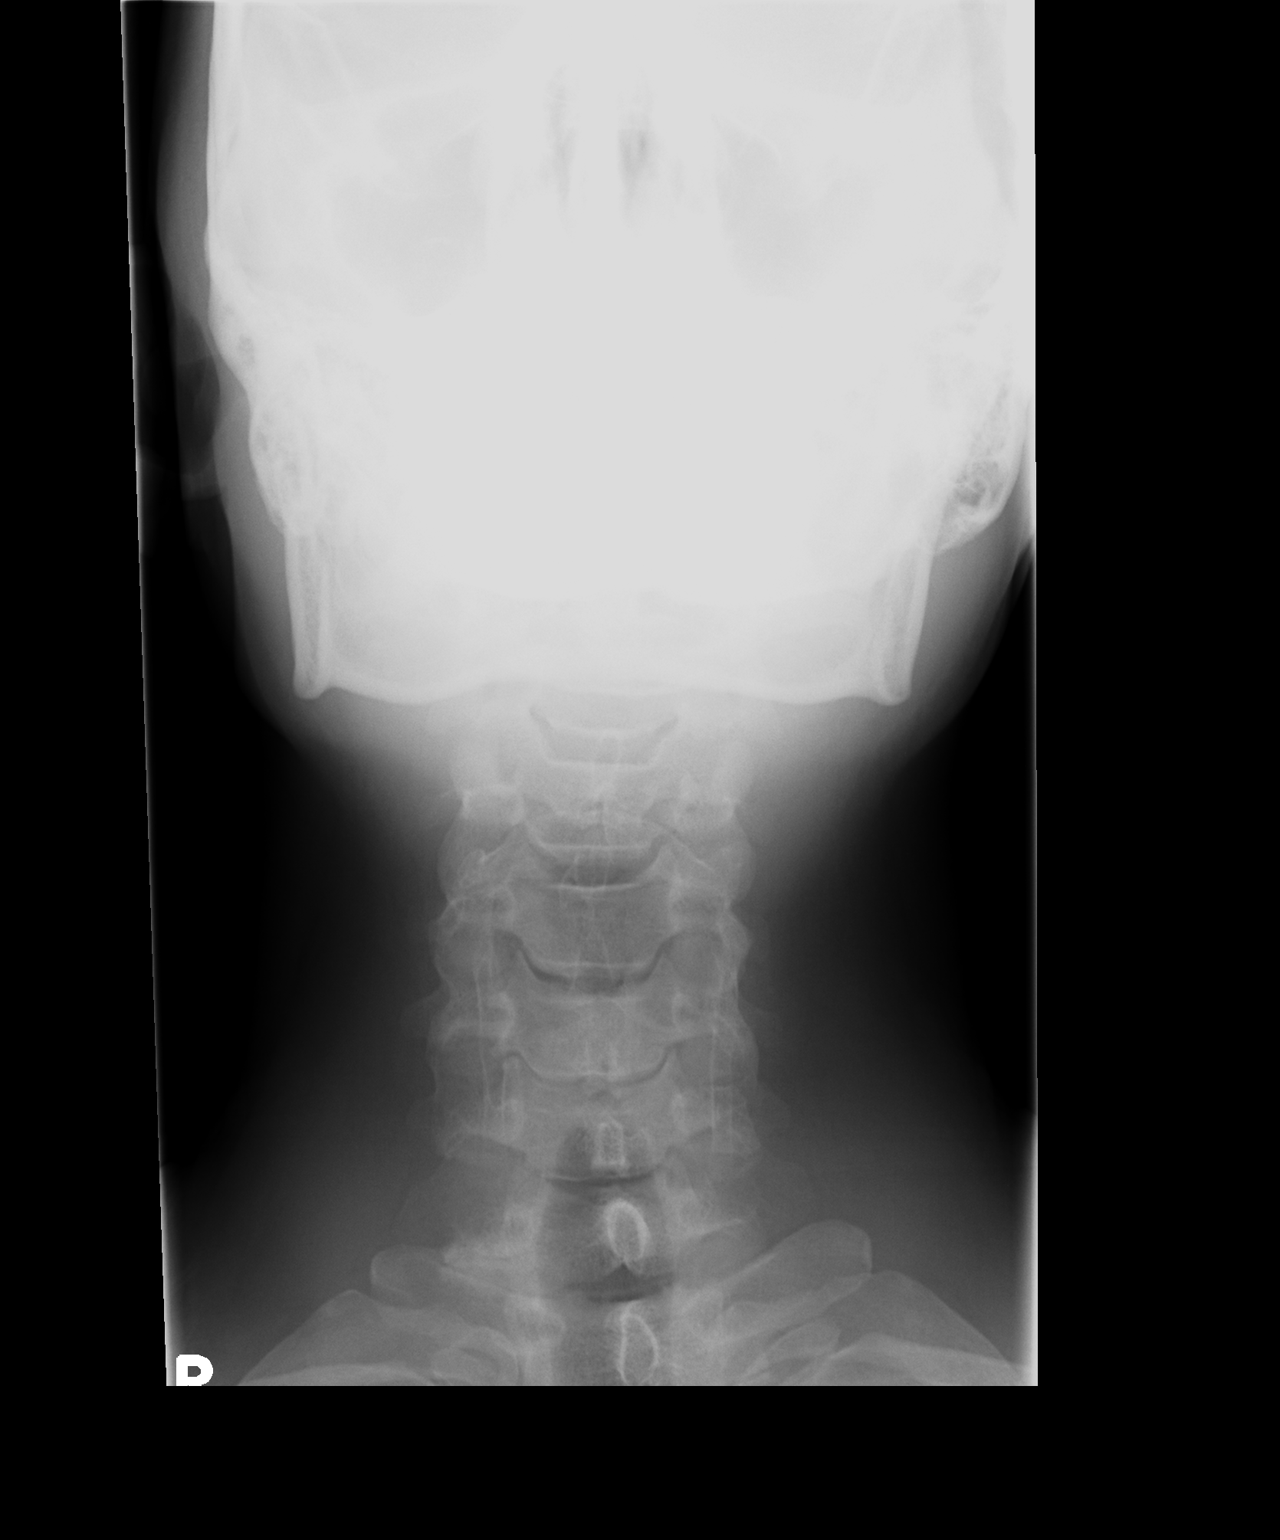

[view not recorded (3 of 3)]
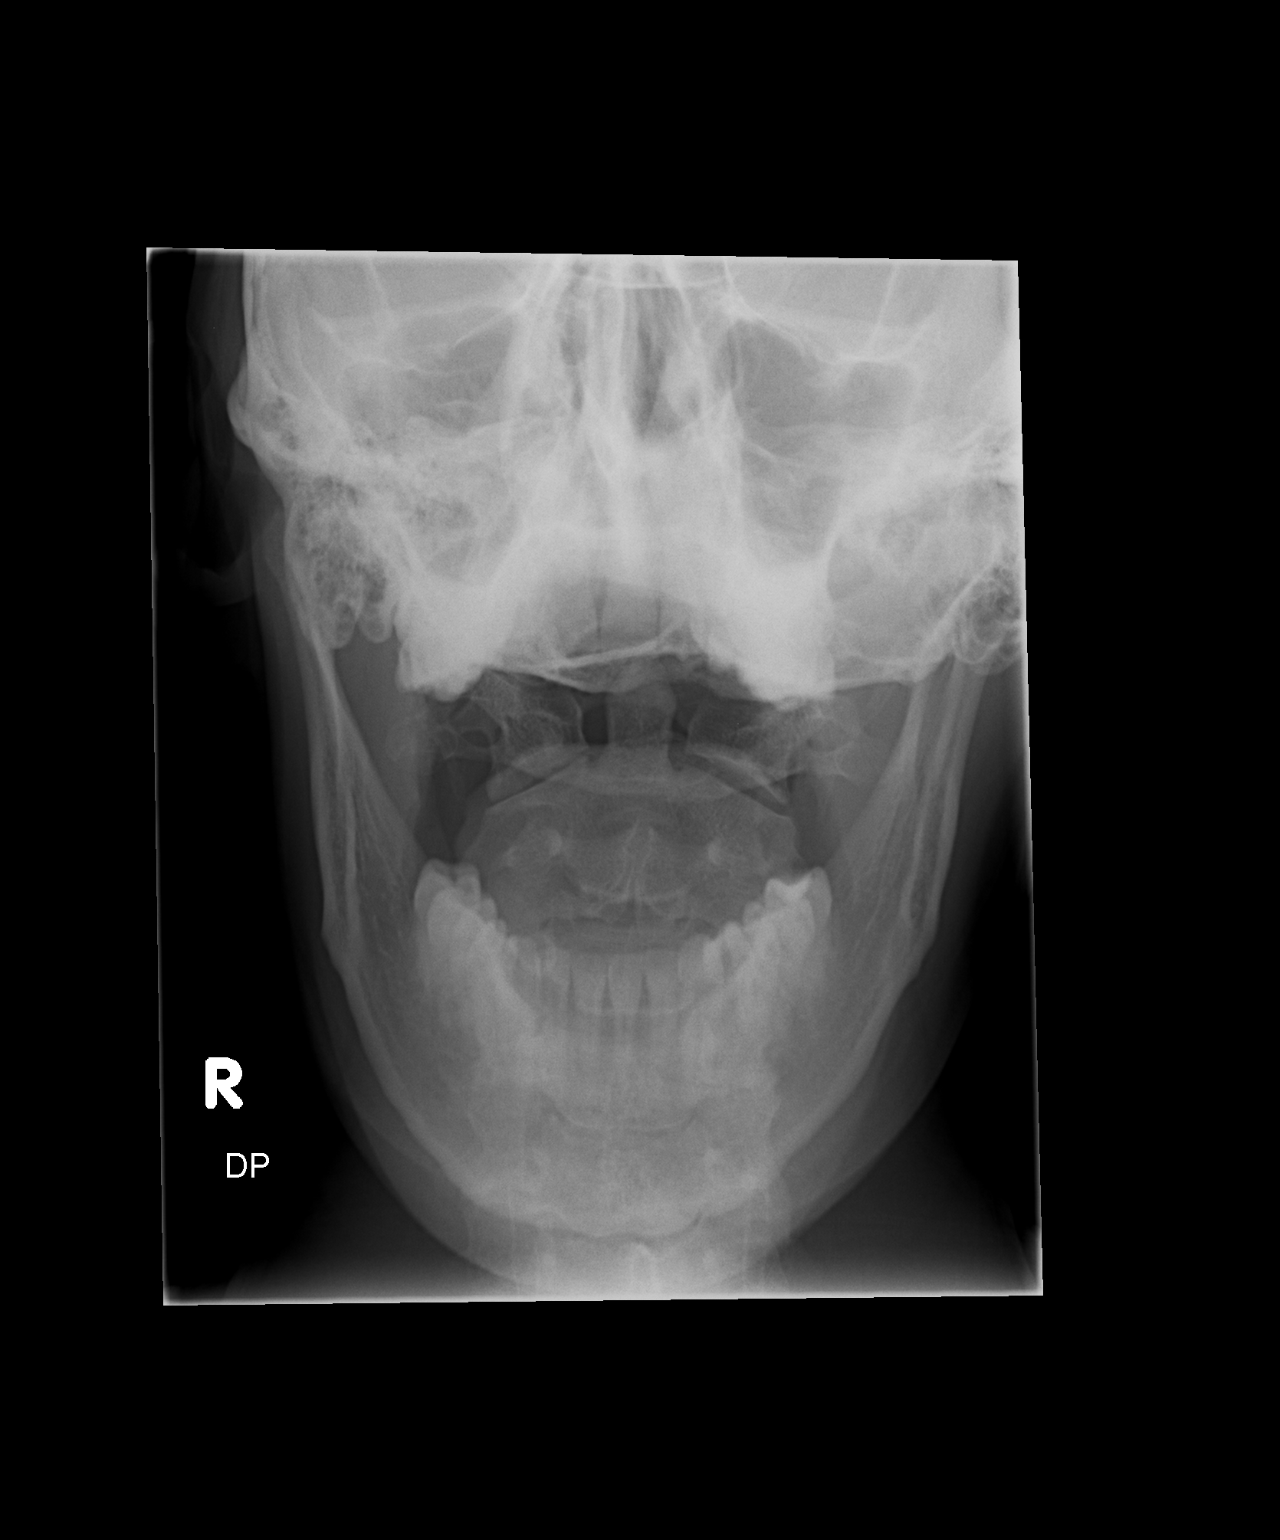

[3 of 3 positions shown; findings below may reference images not displayed]

FINDINGS: There is no evidence of cervical spine fracture or prevertebral soft
tissue swelling. Alignment C1 through C7 is normal. The lateral view
does not show C7-T1. No other significant bone abnormalities are
identified.
IMPRESSION: No evidence for acute abnormality. Incomplete evaluation of the
cervicothoracic junction.

## 2017-06-05 ENCOUNTER — Other Ambulatory Visit (HOSPITAL_COMMUNITY): Payer: Self-pay

## 2017-06-05 DIAGNOSIS — E785 Hyperlipidemia, unspecified: Secondary | ICD-10-CM

## 2017-06-05 NOTE — Progress Notes (Signed)
c-met  

## 2017-06-06 ENCOUNTER — Ambulatory Visit (HOSPITAL_COMMUNITY)
Admission: RE | Admit: 2017-06-06 | Discharge: 2017-06-06 | Disposition: A | Discharge status: Home / Self Care | Payer: 59 | Source: Ambulatory Visit | Attending: Cardiology | Admitting: Cardiology

## 2017-06-06 DIAGNOSIS — E785 Hyperlipidemia, unspecified: Secondary | ICD-10-CM | POA: Insufficient documentation

## 2017-06-06 LAB — COMPREHENSIVE METABOLIC PANEL
ALBUMIN: 3.8 g/dL (ref 3.5–5.0)
ALT: 16 U/L — AB (ref 17–63)
AST: 32 U/L (ref 15–41)
Alkaline Phosphatase: 64 U/L (ref 38–126)
Anion gap: 9 (ref 5–15)
BUN: 16 mg/dL (ref 6–20)
CHLORIDE: 101 mmol/L (ref 101–111)
CO2: 28 mmol/L (ref 22–32)
CREATININE: 1.12 mg/dL (ref 0.61–1.24)
Calcium: 9.4 mg/dL (ref 8.9–10.3)
GFR calc Af Amer: >60 mL/min (ref >60)
GFR calc non Af Amer: >60 mL/min (ref >60)
GLUCOSE: 120 mg/dL — AB (ref 65–99)
Potassium: 4.1 mmol/L (ref 3.5–5.1)
Sodium: 138 mmol/L (ref 135–145)
Total Bilirubin: 0.7 mg/dL (ref 0.3–1.2)
Total Protein: 7.4 g/dL (ref 6.5–8.1)

## 2017-06-06 LAB — LIPID PANEL
Cholesterol: 206 mg/dL — ABNORMAL HIGH (ref 0–200)
HDL: 52 mg/dL (ref >40)
LDL CALC: 141 mg/dL — AB (ref 0–99)
TRIGLYCERIDES: 67 mg/dL (ref <150)
Total CHOL/HDL Ratio: 4 RATIO
VLDL: 13 mg/dL (ref 0–40)

## 2017-08-10 ENCOUNTER — Other Ambulatory Visit: Payer: Self-pay | Admitting: *Deleted

## 2017-08-18 ENCOUNTER — Telehealth (INDEPENDENT_AMBULATORY_CARE_PROVIDER_SITE_OTHER): Payer: Self-pay

## 2017-08-18 NOTE — Telephone Encounter (Signed)
Patient called. Per Dr. Marlou Sa.

## 2017-08-22 NOTE — Telephone Encounter (Signed)
I saw this patient outside of office hours.  He had about a 3 or 4-day history of left knee pain and swelling.  He is been taking ibuprofen but 1 dose only.  He states it is gotten better over the last several days.  Localizes pain primarily anteriorly.  Does not report any mechanical symptoms. On exam he has full range of motion stable collateral cruciate ligaments intact extensor mechanism and mild patellofemoral crepitus.  I looked at his knee under fluoroscopy and there is no arthritis or loose bodies. Impression at this time is likely overuse injury to the left knee with some effusion but no evidence of mechanical or structural damage to the ligaments.  He may also have a degenerative meniscal tear but does not have any mechanical symptoms.  I would favor a course of observation at this time.  Could consider injection as a next step if the effusion recurs or MRI scanning if mechanical symptoms develop

## 2017-08-22 NOTE — Telephone Encounter (Signed)
Noted  

## 2017-10-04 ENCOUNTER — Telehealth (HOSPITAL_COMMUNITY): Payer: Self-pay | Admitting: *Deleted

## 2017-10-04 MED ORDER — TOBRAMYCIN 0.3 % OP SOLN: 2.0000 [drp] | OPHTHALMIC | 0 refills | Status: DC

## 2017-10-04 NOTE — Telephone Encounter (Signed)
Per Oda Kilts, PA pt needs eye drops, order sent to pharmacy

## 2018-02-20 DIAGNOSIS — H5213 Myopia, bilateral: Secondary | ICD-10-CM | POA: Diagnosis not present

## 2018-02-20 DIAGNOSIS — H52223 Regular astigmatism, bilateral: Secondary | ICD-10-CM | POA: Diagnosis not present

## 2018-05-01 ENCOUNTER — Telehealth: Payer: Self-pay | Admitting: Family Medicine

## 2018-05-01 MED ORDER — OSELTAMIVIR PHOSPHATE 75 MG PO CAPS: 75.0000 mg | ORAL_CAPSULE | Freq: Every day | ORAL | 0 refills | Status: DC

## 2018-05-01 NOTE — Addendum Note (Signed)
Addended by: Marin Olp on: 05/01/2018 08:32 PM   Modules accepted: Orders

## 2018-05-01 NOTE — Telephone Encounter (Signed)
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

## 2018-05-01 NOTE — Telephone Encounter (Signed)
See note  Copied from [place] [phone]. Topic: General - Other >> May 01, 2018  4:36 PM Wynetta Emery, [place] C wrote: Reason for CRM:  pt's spouse says that she would like to have a precaution Rx for Tamiflu. Pt says that her son was Dx.    Pharmacy: [place], [place] - [address] [phone] (Phone) [phone] (Fax)

## 2018-05-02 MED FILL — OSELTAMIVIR PHOS 75 MG CAP: 75 | 10 days supply | Qty: 10 | Fill #0

## 2018-07-09 ENCOUNTER — Telehealth: Payer: Self-pay | Admitting: Family Medicine

## 2018-07-09 NOTE — Telephone Encounter (Signed)
Copied from [place] [phone]. Topic: General - Other >> Jul 09, 2018  2:05 PM Carolyn Stare wrote:  Pt lvm on [facility] appt line   Pt would like to schedule a CPE with Dr Yong Channel has not been seen since 2018

## 2018-07-09 NOTE — Telephone Encounter (Signed)
LVM for patient to call back and schedule a CPE

## 2018-10-05 ENCOUNTER — Encounter: Payer: Self-pay | Admitting: Family Medicine

## 2018-10-05 ENCOUNTER — Other Ambulatory Visit: Payer: Self-pay

## 2018-10-05 ENCOUNTER — Ambulatory Visit (INDEPENDENT_AMBULATORY_CARE_PROVIDER_SITE_OTHER): Payer: 59 | Admitting: Family Medicine

## 2018-10-05 VITALS — BP 110/78 | HR 59 | Temp 98.4°F | Ht 75.5 in | Wt 190.4 lb

## 2018-10-05 DIAGNOSIS — E785 Hyperlipidemia, unspecified: Secondary | ICD-10-CM

## 2018-10-05 DIAGNOSIS — Z0000 Encounter for general adult medical examination without abnormal findings: Secondary | ICD-10-CM

## 2018-10-05 MED ORDER — ROSUVASTATIN CALCIUM 5 MG PO TABS: 5.0000 mg | ORAL_TABLET | Freq: Every day | ORAL | 3 refills | Status: DC

## 2018-10-05 NOTE — Progress Notes (Signed)
Phone: [phone]    Subjective:  Patient presents today for their annual physical. Chief complaint-noted.   See problem oriented charting- ROS- full  review of systems was completed and negative including No chest pain or shortness of breath. No headache or blurry vision.   The following were reviewed and entered/updated in epic: Past Medical History:  Diagnosis Date  . Hyperlipidemia    likely crestor 5mg . LDL 170 in 2016 and strong family history CAD in mom and dad  . UTI (urinary tract infection)    ER visit. E. Coli. Saw urology- no further workup.    Patient Active Problem List   Diagnosis Date Noted  . Hyperlipidemia     Priority: Medium  . Lateral epicondylitis of right elbow 04/01/2015    Priority: Low  . 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 none      Family History  Problem Relation Age of Onset  . Heart disease Mother        78s. also mitral valve repair  . Hyperlipidemia Mother   . Breast cancer Mother   . Heart disease Father   . Hyperlipidemia Father     Medications- reviewed and updated Current Outpatient Medications  Medication Sig Dispense Refill  . acetaminophen (TYLENOL) 500 MG tablet Take 500 mg by mouth every 6 (six) hours as needed for mild pain.    . rosuvastatin (CRESTOR) 5 MG tablet Take 1 tablet (5 mg total) by mouth daily. 90 tablet 3   No current facility-administered medications for this visit.     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       Enjoys exercising, time with family      Objective:  BP 110/78 (BP Location:  Left Arm, Patient Position: Sitting, Cuff Size: Normal)   Pulse (!) 59   Temp 98.4 F (36.9 C) (Oral)   Ht 6' 3.5" (1.918 m)   Wt 190 lb 6.4 oz (86.4 kg)   SpO2 97%   BMI 23.48 kg/m  Gen: NAD, resting comfortably HEENT: Mucous membranes are moist. Oropharynx normal Neck: no thyromegaly or carotid bruits CV: RRR no murmurs rubs or gallops Lungs: CTAB no crackles, wheeze, rhonchi Abdomen: soft/nontender/nondistended/normal bowel sounds. No rebound or guarding.  Ext: no edema and 2+ PT pulses Skin: warm, dry Neuro: grossly normal, moves all extremities, PERRLA     Assessment and Plan:  43 y.o. male presenting for annual physical.  Health Maintenance counseling: 1. Anticipatory guidance: Patient counseled regarding regular dental exams -q6 months, eye exams - yearly [employer],  avoiding smoking and second hand smoke , limiting alcohol to 2 beverages per day.   2. Risk factor reduction:  Advised patient of need for regular exercise and diet rich and fruits and vegetables to reduce risk of heart attack and stroke. Exercise- 4-5 days a week. Diet-reasonably healthy- feels could do better.  Wt Readings from Last 3 Encounters:  10/05/18 190 lb 6.4 oz (86.4 kg)  09/23/16 194 lb 12.8 oz (88.4 kg)  12/07/15 190 lb (86.2 kg)  3. Immunizations/screenings/ancillary studies Immunization History  Administered Date(s) Administered  . Tdap 09/23/2016  4. Prostate cancer screening-  no family history, start at age 52  5. Colon cancer screening -  no family history, start at age 70. He would like to consider cologuard if covered at 42.  6. Skin cancer screening/prevention- has seen Dr. Ronnald Ramp at [facility] a few years ago.  advised regular sunscreen use. Denies worrisome, changing, or new skin lesions.  7. Testicular cancer screening- advised monthly self exams  8. STD screening- patient opts out as monogomous 9. Never smoker-   Status of chronic or acute concerns   Patient with hyperlipidemia  and strong family history of CAD in both mother and father- we will start rosuvastatin 5 mg.  He agrees to get lipids checked in 4 to 6 weeks at his office-labs ordered today.  We will also get TSH to make sure thyroid not involved with hyperlipidemia  Lab/Order associations: We will get future fasting labs at his office   ICD-10-CM   1. Preventative health care  Z00.00 CBC    Comprehensive metabolic panel    Lipid panel    TSH  2. Hyperlipidemia, unspecified hyperlipidemia type  E78.5 CBC    Comprehensive metabolic panel    Lipid panel    TSH   Meds ordered this encounter  Medications  . rosuvastatin (CRESTOR) 5 MG tablet    Sig: Take 1 tablet (5 mg total) by mouth daily.    Dispense:  90 tablet    Refill:  3   Return precautions advised.  [name], MD

## 2018-10-05 NOTE — Patient Instructions (Addendum)
Get your labs in 4-6 weeks and we will reach out with results  Glad you are doing so well!

## 2018-10-11 DIAGNOSIS — Z20828 Contact with and (suspected) exposure to other viral communicable diseases: Secondary | ICD-10-CM | POA: Diagnosis not present

## 2018-12-17 ENCOUNTER — Other Ambulatory Visit: Payer: Self-pay | Admitting: Family Medicine

## 2018-12-17 MED ORDER — ROSUVASTATIN CALCIUM 5 MG PO TABS: 5.0000 mg | ORAL_TABLET | Freq: Every day | ORAL | 3 refills | Status: DC

## 2018-12-17 MED FILL — ROSUVASTATIN CALCIUM 5 MG T: 5 | 90 days supply | Qty: 90 | Fill #0

## 2019-01-02 DIAGNOSIS — Z20828 Contact with and (suspected) exposure to other viral communicable diseases: Secondary | ICD-10-CM | POA: Diagnosis not present

## 2019-01-04 MED FILL — ROSUVASTATIN CALCIUM 5 MG T: 5 | 90 days supply | Qty: 90 | Fill #0

## 2019-02-14 DIAGNOSIS — H5213 Myopia, bilateral: Secondary | ICD-10-CM | POA: Diagnosis not present

## 2019-02-14 DIAGNOSIS — H53143 Visual discomfort, bilateral: Secondary | ICD-10-CM | POA: Diagnosis not present

## 2019-02-14 DIAGNOSIS — H52222 Regular astigmatism, left eye: Secondary | ICD-10-CM | POA: Diagnosis not present

## 2019-06-21 MED FILL — ROSUVASTATIN CALCIUM 5 MG T: 5 | 90 days supply | Qty: 90 | Fill #1

## 2019-11-25 MED FILL — ROSUVASTATIN CALCIUM 5 MG T: 5 | 90 days supply | Qty: 90 | Fill #2

## 2019-12-04 MED FILL — ROSUVASTATIN CALCIUM 5 MG T: 5 | 90 days supply | Qty: 90 | Fill #2

## 2019-12-31 DIAGNOSIS — Z20828 Contact with and (suspected) exposure to other viral communicable diseases: Secondary | ICD-10-CM | POA: Diagnosis not present

## 2020-01-14 ENCOUNTER — Other Ambulatory Visit: Payer: Self-pay

## 2020-01-14 ENCOUNTER — Other Ambulatory Visit (HOSPITAL_COMMUNITY): Payer: Self-pay

## 2020-01-14 ENCOUNTER — Ambulatory Visit (HOSPITAL_COMMUNITY)
Admission: RE | Admit: 2020-01-14 | Discharge: 2020-01-14 | Disposition: A | Discharge status: Home / Self Care | Payer: 59 | Source: Ambulatory Visit | Attending: Family Medicine | Admitting: Family Medicine

## 2020-01-14 DIAGNOSIS — E785 Hyperlipidemia, unspecified: Secondary | ICD-10-CM | POA: Insufficient documentation

## 2020-01-14 LAB — LIPID PANEL
Cholesterol: 201 mg/dL — ABNORMAL HIGH (ref 0–200)
HDL: 47 mg/dL (ref >40)
LDL Cholesterol: 139 mg/dL — ABNORMAL HIGH (ref 0–99)
Total CHOL/HDL Ratio: 4.3 RATIO
Triglycerides: 74 mg/dL (ref <150)
VLDL: 15 mg/dL (ref 0–40)

## 2020-01-14 LAB — HEPATIC FUNCTION PANEL
ALT: 20 U/L (ref 0–44)
AST: 20 U/L (ref 15–41)
Albumin: 3.8 g/dL (ref 3.5–5.0)
Alkaline Phosphatase: 44 U/L (ref 38–126)
Bilirubin, Direct: <0.1 mg/dL (ref 0.0–0.2)
Total Bilirubin: 0.7 mg/dL (ref 0.3–1.2)
Total Protein: 6.7 g/dL (ref 6.5–8.1)

## 2020-01-15 ENCOUNTER — Other Ambulatory Visit: Payer: Self-pay

## 2020-01-15 MED ORDER — ROSUVASTATIN CALCIUM 20 MG PO TABS
20.0000 mg | ORAL_TABLET | Freq: Every day | ORAL | 3 refills | Status: DC
Start: 2020-01-15 — End: 2020-02-03

## 2020-01-15 MED ORDER — ROSUVASTATIN CALCIUM 20 MG PO TABS: 20.0000 mg | ORAL_TABLET | Freq: Every day | ORAL | 3 refills | Status: DC

## 2020-02-03 ENCOUNTER — Other Ambulatory Visit: Payer: Self-pay | Admitting: Family Medicine

## 2020-02-03 MED ORDER — ROSUVASTATIN CALCIUM 20 MG PO TABS
20.0000 mg | ORAL_TABLET | Freq: Every day | ORAL | 3 refills | Status: DC
Start: 2020-02-03 — End: 2020-06-04

## 2020-02-04 MED FILL — ROSUVASTATIN CALCIUM 20 MG: 20 | 90 days supply | Qty: 90 | Fill #0

## 2020-03-03 DIAGNOSIS — H52222 Regular astigmatism, left eye: Secondary | ICD-10-CM | POA: Diagnosis not present

## 2020-03-03 DIAGNOSIS — H5213 Myopia, bilateral: Secondary | ICD-10-CM | POA: Diagnosis not present

## 2020-03-03 DIAGNOSIS — H16041 Marginal corneal ulcer, right eye: Secondary | ICD-10-CM | POA: Diagnosis not present

## 2020-04-07 ENCOUNTER — Other Ambulatory Visit (HOSPITAL_COMMUNITY): Payer: Self-pay | Admitting: *Deleted

## 2020-04-07 ENCOUNTER — Other Ambulatory Visit: Payer: Self-pay

## 2020-04-07 ENCOUNTER — Ambulatory Visit (HOSPITAL_COMMUNITY)
Admission: RE | Admit: 2020-04-07 | Discharge: 2020-04-07 | Disposition: A | Discharge status: Home / Self Care | Payer: 59 | Source: Ambulatory Visit | Attending: Internal Medicine | Admitting: Internal Medicine

## 2020-04-07 DIAGNOSIS — Z20822 Contact with and (suspected) exposure to covid-19: Secondary | ICD-10-CM

## 2020-04-07 DIAGNOSIS — U071 COVID-19: Secondary | ICD-10-CM | POA: Diagnosis not present

## 2020-04-07 LAB — SARS CORONAVIRUS 2 BY RT PCR (HOSPITAL ORDER, PERFORMED IN ~~LOC~~ HOSPITAL LAB): SARS Coronavirus 2: POSITIVE — AB

## 2020-05-28 MED FILL — ROSUVASTATIN CALCIUM 20 MG: 20 | 90 days supply | Qty: 90 | Fill #1

## 2020-06-03 NOTE — Patient Instructions (Addendum)
Please stop by lab before you go If you have mychart- we will send your results within 3 business days of [place] receiving them.  If you do not have mychart- we will call you about results within 5 business days of [place] receiving them.  *please also note that you will see labs on mychart as soon as they post. I will later go in and write notes on them- will say "notes from Dr. Yong Channel"  Health Maintenance Due  Topic Date Due  . COVID-19 Vaccine (1)- call us with dates if you can Never done

## 2020-06-03 NOTE — Progress Notes (Signed)
Phone: [phone]    Subjective:  Patient presents today for their annual physical. Chief complaint-noted.   See problem oriented charting- ROS- full  review of systems was completed and negative  except for: occasional aches and pains orthopedic related but no major issues  The following were reviewed and entered/updated in epic: Past Medical History:  Diagnosis Date  . Hyperlipidemia    likely crestor 5mg . LDL 170 in 2016 and strong family history CAD in mom and dad  . UTI (urinary tract infection)    ER visit. E. Coli. Saw urology- no further workup.    Patient Active Problem List   Diagnosis Date Noted  . Hyperlipidemia     Priority: Medium  . Lateral epicondylitis of right elbow 04/01/2015    Priority: Low  . 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 none      Family History  Problem Relation Age of Onset  . Heart disease Mother        58s. also mitral valve repair  . Hyperlipidemia Mother   . Breast cancer Mother   . Heart disease Father   . Hyperlipidemia Father     Medications- reviewed and updated Current Outpatient Medications  Medication Sig Dispense Refill  . acetaminophen (TYLENOL) 500 MG tablet Take 500 mg by mouth every 6 (six) hours as needed for mild pain.    . rosuvastatin (CRESTOR) 20 MG tablet Take 1 tablet (20 mg total) by mouth daily. 90 tablet 3   No current facility-administered medications for this visit.    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       Enjoys exercising, time with family      Objective:  BP 116/69   Pulse 60    Temp 98.6 F (37 C) (Temporal)   Ht 6' 3.5" (1.918 m)   Wt 194 lb 9.6 oz (88.3 kg)   SpO2 98%   BMI 24.00 kg/m  Gen: NAD, resting comfortably HEENT: Mucous membranes are moist. Oropharynx normal Neck: no thyromegaly CV: RRR no murmurs rubs or gallops Lungs: CTAB no crackles, wheeze, rhonchi Abdomen: soft/nontender/nondistended/normal bowel sounds. No rebound or guarding.  Ext: no edema Skin: warm, dry Neuro: grossly normal, moves all extremities, PERRLA    Assessment and Plan:  45 y.o. male presenting for annual physical.  Health Maintenance counseling: 1. Anticipatory guidance: Patient counseled regarding regular dental exams -q6 months, eye exams - yearly [facility],  avoiding smoking and second hand smoke , limiting alcohol to 2 beverages per day.   2. Risk factor reduction:  Advised patient of need for regular exercise and diet rich and fruits and vegetables to reduce risk of heart attack and stroke. Exercise- 4-5 days a week. Diet-relatively healthy.  Wt Readings from Last 3 Encounters:  06/04/20 194 lb 9.6 oz (88.3 kg)  10/05/18 190 lb 6.4 oz (86.4 kg)  09/23/16 194 lb 12.8 oz (88.4 kg)  3. Immunizations/screenings/ancillary studies- covid vaccines have been completed.  Immunization History  Administered Date(s) Administered  . Influenza-Unspecified 12/27/2019  . Tdap 09/23/2016  4. Prostate cancer screening- no  family history, start at age 78 5. Colon cancer screening - refer for colonoscopy as turns 45 in less than 3 weeks 6. Skin cancer screening/prevention- Dr. Ronnald Ramp [facility] derm in past. advised regular sunscreen use. Denies worrisome, changing, or new skin lesions.  7. Testicular cancer screening- advised monthly self exams  8. STD screening- patient opts out as monogomous 9. Never smoker-   Status of chronic or acute concerns   #hyperlipidemia S: Medication: Rosuvastatin 20Mg - taking most days Lab Results  Component Value Date   CHOL 201 (H) 01/14/2020   HDL  47 01/14/2020   LDLCALC 139 (H) 01/14/2020   TRIG 74 01/14/2020   CHOLHDL 4.3 01/14/2020   A/P: hopefully improved- update Lipid panel today  Recommended follow up: Return in about 1 year (around 06/04/2021) for physical or sooner if needed.  Lab/Order associations: NOT fasting   ICD-10-CM   1. Preventative health care  Z00.00 CBC with Differential/Platelet    Comprehensive metabolic panel    Lipid panel    Hepatitis C antibody    Ambulatory referral to Gastroenterology  2. Hyperlipidemia, unspecified hyperlipidemia type  E78.5 CBC with Differential/Platelet    Comprehensive metabolic panel    Lipid panel  3. Encounter for hepatitis C screening test for low risk patient  Z11.59 Hepatitis C antibody  4. Screen for colon cancer  Z12.11 Ambulatory referral to Gastroenterology   Return precautions advised.   [name], MD

## 2020-06-04 ENCOUNTER — Other Ambulatory Visit: Payer: Self-pay | Admitting: Family Medicine

## 2020-06-04 ENCOUNTER — Encounter: Payer: Self-pay | Admitting: Family Medicine

## 2020-06-04 ENCOUNTER — Ambulatory Visit (INDEPENDENT_AMBULATORY_CARE_PROVIDER_SITE_OTHER): Payer: 59 | Admitting: Family Medicine

## 2020-06-04 ENCOUNTER — Other Ambulatory Visit: Payer: Self-pay

## 2020-06-04 VITALS — BP 116/69 | HR 60 | Temp 98.6°F | Ht 75.5 in | Wt 194.6 lb

## 2020-06-04 DIAGNOSIS — Z1211 Encounter for screening for malignant neoplasm of colon: Secondary | ICD-10-CM | POA: Diagnosis not present

## 2020-06-04 DIAGNOSIS — Z1159 Encounter for screening for other viral diseases: Secondary | ICD-10-CM | POA: Diagnosis not present

## 2020-06-04 DIAGNOSIS — E785 Hyperlipidemia, unspecified: Secondary | ICD-10-CM

## 2020-06-04 DIAGNOSIS — Z0000 Encounter for general adult medical examination without abnormal findings: Secondary | ICD-10-CM

## 2020-06-04 MED ORDER — ROSUVASTATIN CALCIUM 20 MG PO TABS
20.0000 mg | ORAL_TABLET | Freq: Every day | ORAL | 3 refills | Status: DC
Start: 2020-06-04 — End: 2020-06-04

## 2020-06-05 LAB — CBC WITH DIFFERENTIAL/PLATELET
Basophils Absolute: 0 10*3/uL (ref 0.0–0.1)
Basophils Relative: 0.9 % (ref 0.0–3.0)
Eosinophils Absolute: 0.1 10*3/uL (ref 0.0–0.7)
Eosinophils Relative: 1.5 % (ref 0.0–5.0)
HCT: 42.9 % (ref 39.0–52.0)
Hemoglobin: 14.4 g/dL (ref 13.0–17.0)
Lymphocytes Relative: 37.1 % (ref 12.0–46.0)
Lymphs Abs: 1.8 10*3/uL (ref 0.7–4.0)
MCHC: 33.5 g/dL (ref 30.0–36.0)
MCV: 88.1 fl (ref 78.0–100.0)
Monocytes Absolute: 0.4 10*3/uL (ref 0.1–1.0)
Monocytes Relative: 8.2 % (ref 3.0–12.0)
Neutro Abs: 2.6 10*3/uL (ref 1.4–7.7)
Neutrophils Relative %: 52.3 % (ref 43.0–77.0)
Platelets: 235 10*3/uL (ref 150.0–400.0)
RBC: 4.87 Mil/uL (ref 4.22–5.81)
RDW: 14 % (ref 11.5–15.5)
WBC: 4.9 10*3/uL (ref 4.0–10.5)

## 2020-06-05 LAB — COMPREHENSIVE METABOLIC PANEL
ALT: 13 U/L (ref 0–53)
AST: 14 U/L (ref 0–37)
Albumin: 4.2 g/dL (ref 3.5–5.2)
Alkaline Phosphatase: 51 U/L (ref 39–117)
BUN: 15 mg/dL (ref 6–23)
CO2: 29 mEq/L (ref 19–32)
Calcium: 8.9 mg/dL (ref 8.4–10.5)
Chloride: 103 mEq/L (ref 96–112)
Creatinine, Ser: 1.25 mg/dL (ref 0.40–1.50)
GFR: 69.81 mL/min (ref >60.00)
Glucose, Bld: 76 mg/dL (ref 70–99)
Potassium: 4.2 mEq/L (ref 3.5–5.1)
Sodium: 139 mEq/L (ref 135–145)
Total Bilirubin: 0.5 mg/dL (ref 0.2–1.2)
Total Protein: 6.5 g/dL (ref 6.0–8.3)

## 2020-06-05 LAB — HEPATITIS C ANTIBODY
Hepatitis C Ab: NONREACTIVE
SIGNAL TO CUT-OFF: 0.01 (ref <1.00)

## 2020-06-05 LAB — LIPID PANEL
Cholesterol: 144 mg/dL (ref 0–200)
HDL: 51.2 mg/dL (ref >39.00)
LDL Cholesterol: 63 mg/dL (ref 0–99)
NonHDL: 92.79
Total CHOL/HDL Ratio: 3
Triglycerides: 150 mg/dL — ABNORMAL HIGH (ref 0.0–149.0)
VLDL: 30 mg/dL (ref 0.0–40.0)

## 2020-06-08 ENCOUNTER — Telehealth: Payer: Self-pay | Admitting: *Deleted

## 2020-06-08 NOTE — Telephone Encounter (Signed)
-----   Message from Jerene Bears, MD sent at 06/05/2020  4:44 PM EST ----- Pt will reach 45 soon He is interested in setting up screening colon Please call him to do so Thanks JMP

## 2020-06-08 NOTE — Telephone Encounter (Signed)
I have left a message for patient to call back to schedule colonoscopy.

## 2020-06-17 NOTE — Telephone Encounter (Signed)
Patient called back and scheduled appointment for previsit and colonoscopy with Dr Rush Landmark.

## 2020-07-15 ENCOUNTER — Other Ambulatory Visit (HOSPITAL_COMMUNITY): Payer: Self-pay

## 2020-07-15 ENCOUNTER — Other Ambulatory Visit: Payer: Self-pay

## 2020-07-15 ENCOUNTER — Ambulatory Visit (AMBULATORY_SURGERY_CENTER): Payer: 59

## 2020-07-15 VITALS — Ht 75.5 in | Wt 195.0 lb

## 2020-07-15 DIAGNOSIS — Z1211 Encounter for screening for malignant neoplasm of colon: Secondary | ICD-10-CM

## 2020-07-15 MED ORDER — NA SULFATE-K SULFATE-MG SULF 17.5-3.13-1.6 GM/177ML PO SOLN
1.0000 | Freq: Once | ORAL | 0 refills | Status: AC
  Filled 2020-07-15: qty 354, 1d supply, fill #0

## 2020-07-15 NOTE — Progress Notes (Signed)
Pre visit completed via phone call;  Patient verified name, DOB, and address; No egg or soy allergy known to patient  No issues with past sedation with any surgeries or procedures---no past surgeries Patient denies ever being told they had issues or difficulty with intubation ---no past surgeries No FH of Malignant Hyperthermia No diet pills per patient No home 02 use per patient  No blood thinners per patient  Pt denies issues with constipation  No A fib or A flutter  COVID 19 guidelines implemented in [facility] today with Pt and RN  Pt is fully vaccinated for Covid; NO PA's for preps discussed with pt in [facility] today  Discussed with pt there will be an out-of-pocket cost for prep and that varies from $0 to 70 dollars  Due to the COVID-19 pandemic we are asking patients to follow certain guidelines.  Pt aware of COVID protocols and [facility] guidelines

## 2020-07-23 ENCOUNTER — Other Ambulatory Visit (HOSPITAL_COMMUNITY): Payer: Self-pay

## 2020-07-31 ENCOUNTER — Other Ambulatory Visit (HOSPITAL_COMMUNITY): Payer: Self-pay

## 2020-07-31 ENCOUNTER — Telehealth: Payer: Self-pay | Admitting: Gastroenterology

## 2020-07-31 MED ORDER — SUPREP BOWEL PREP KIT 17.5-3.13-1.6 GM/177ML PO SOLN
1.0000 | Freq: Once | ORAL | 0 refills | Status: AC
  Filled 2020-07-31: qty 354, 1d supply, fill #0

## 2020-07-31 NOTE — Telephone Encounter (Signed)
Patient's wife called requesting for prep medication to be sent to the pharmacy.

## 2020-07-31 NOTE — Telephone Encounter (Signed)
Suprep to [facility] - called number left for TE-  No answer- LM if not [facility], please CB and inform which pharmacy

## 2020-08-03 ENCOUNTER — Encounter: Payer: Self-pay | Admitting: Gastroenterology

## 2020-08-05 ENCOUNTER — Encounter: Payer: Self-pay | Admitting: Gastroenterology

## 2020-08-05 ENCOUNTER — Encounter: Payer: 59 | Admitting: Gastroenterology

## 2020-08-05 ENCOUNTER — Ambulatory Visit (AMBULATORY_SURGERY_CENTER): Payer: 59 | Admitting: Gastroenterology

## 2020-08-05 ENCOUNTER — Other Ambulatory Visit: Payer: Self-pay

## 2020-08-05 VITALS — BP 127/72 | HR 63 | Temp 98.9°F | Resp 16 | Ht 75.5 in | Wt 195.0 lb

## 2020-08-05 DIAGNOSIS — K635 Polyp of colon: Secondary | ICD-10-CM | POA: Diagnosis not present

## 2020-08-05 DIAGNOSIS — Z1211 Encounter for screening for malignant neoplasm of colon: Secondary | ICD-10-CM

## 2020-08-05 DIAGNOSIS — D120 Benign neoplasm of cecum: Secondary | ICD-10-CM

## 2020-08-05 MED ORDER — SODIUM CHLORIDE 0.9 % IV SOLN: 500.0000 mL | Freq: Once | INTRAVENOUS | Status: DC

## 2020-08-05 NOTE — Op Note (Signed)
[place] Patient Name: John Waters Procedure Date: 08/05/2020 4:24 PM MRN: [id number] Endoscopist: Justice Britain , MD Age: 45 Referring MD:  Date of Birth: 08/29/1975 Gender: Male Account #: [id number] Procedure:                Colonoscopy Indications:              Screening for colorectal malignant neoplasm, This                            is the patient's first colonoscopy, Family history                            of colon cancer in a distant relative Medicines:                Monitored Anesthesia Care Procedure:                Pre-Anesthesia Assessment:                           - Prior to the procedure, a History and Physical                            was performed, and patient medications and                            allergies were reviewed. The patient's tolerance of                            previous anesthesia was also reviewed. The risks                            and benefits of the procedure and the sedation                            options and risks were discussed with the patient.                            All questions were answered, and informed consent                            was obtained. Prior Anticoagulants: The patient has                            taken no previous anticoagulant or antiplatelet                            agents. ASA Grade Assessment: I - A normal, healthy                            patient. After reviewing the risks and benefits,                            the patient was deemed in satisfactory condition to  undergo the procedure.                           After obtaining informed consent, the colonoscope                            was passed under direct vision. Throughout the                            procedure, the patient's blood pressure, pulse, and                            oxygen saturations were monitored continuously. The                            Colonoscope was introduced  through the anus and                            advanced to the 5 cm into the ileum. The                            colonoscopy was performed without difficulty. The                            patient tolerated the procedure. The quality of the                            bowel preparation was adequate. The terminal ileum,                            ileocecal valve, appendiceal orifice, and rectum                            were photographed. Scope In: 4:33:52 PM Scope Out: 4:57:58 PM Scope Withdrawal Time: 0 hours 16 minutes 49 seconds  Total Procedure Duration: 0 hours 24 minutes 6 seconds  Findings:                 The digital rectal exam findings include                            hemorrhoids. Pertinent negatives include no                            palpable rectal lesions.                           A moderate amount of liquid stool was found in the                            entire colon, interfering slightly with                            visualization. Lavage of the entire area was  performed using copious amounts, resulting in                            clearance with adequate visualization.                           The terminal ileum and ileocecal valve appeared                            normal.                           A 3 mm polyp was found in the cecum. The polyp was                            sessile. The polyp was removed with a cold snare.                            Resection and retrieval were complete.                           Normal mucosa was found in the entire colon                            otherwise.                           Non-bleeding non-thrombosed internal hemorrhoids                            were found during retroflexion, during perianal                            exam, during digital exam and during endoscopy. The                            hemorrhoids were Grade II (internal hemorrhoids                            that  prolapse but reduce spontaneously). Complications:            No immediate complications. Estimated Blood Loss:     Estimated blood loss was minimal. Impression:               - Hemorrhoids found on digital rectal exam.                           - Stool in the entire examined colon.                           - The examined portion of the ileum was normal.                           - One 3 mm polyp in the cecum, removed with a cold  snare. Resected and retrieved.                           - Normal mucosa in the entire examined colon                            otherwise.                           - Non-bleeding non-thrombosed internal hemorrhoids. Recommendation:           - The patient will be observed post-procedure,                            until all discharge criteria are met.                           - Discharge patient to home.                           - Patient has a contact number available for                            emergencies. The signs and symptoms of potential                            delayed complications were discussed with the                            patient. Return to normal activities tomorrow.                            Written discharge instructions were provided to the                            patient.                           - High fiber diet.                           - Use FiberCon 1-2 tablets PO daily.                           - Continue present medications.                           - Await pathology results.                           - Repeat colonoscopy in 08/01/08 years for                            surveillance based on pathology results and                            findings of adenomatous tissue.                           -  The findings and recommendations were discussed                            with the patient.                           - The findings and recommendations were discussed                             with the patient's family. Justice Britain, MD 08/05/2020 5:03:25 PM

## 2020-08-05 NOTE — Progress Notes (Signed)
PT taken to PACU. Monitors in place. VSS. Report given to RN. 

## 2020-08-05 NOTE — Progress Notes (Signed)
Called to room to assist during endoscopic procedure.  Patient ID and intended procedure confirmed with present staff. Received instructions for my participation in the procedure from the performing physician.  

## 2020-08-05 NOTE — Patient Instructions (Signed)
YOU HAD AN ENDOSCOPIC PROCEDURE TODAY AT [facility]:   Refer to the procedure report that was given to you for any specific questions about what was found during the examination.  If the procedure report does not answer your questions, please call your gastroenterologist to clarify.  If you requested that your care partner not be given the details of your procedure findings, then the procedure report has been included in a sealed envelope for you to review at your convenience later.  YOU SHOULD EXPECT: Some feelings of bloating in the abdomen. Passage of more gas than usual.  Walking can help get rid of the air that was put into your GI tract during the procedure and reduce the bloating. If you had a lower endoscopy (such as a colonoscopy or flexible sigmoidoscopy) you may notice spotting of blood in your stool or on the toilet paper. If you underwent a bowel prep for your procedure, you may not have a normal bowel movement for a few days.  Please Note:  You might notice some irritation and congestion in your nose or some drainage.  This is from the oxygen used during your procedure.  There is no need for concern and it should clear up in a day or so.  SYMPTOMS TO REPORT IMMEDIATELY:   Following lower endoscopy (colonoscopy or flexible sigmoidoscopy):  Excessive amounts of blood in the stool  Significant tenderness or worsening of abdominal pains  Swelling of the abdomen that is new, acute  Fever of 100F or higher   For urgent or emergent issues, a gastroenterologist can be reached at any hour by calling [phone]. Do not use MyChart messaging for urgent concerns.    DIET:  We do recommend a small meal at first, but then you may proceed to your regular diet.  Drink plenty of fluids but you should avoid alcoholic beverages for 24 hours. Follow a High Fiber Diet (see handout given to you by your recovery nurse).  MEDICATIONS: Continue present medications. Use FiberCon 1-  tablets by mouth daily.  Please see handouts given to you by your recovery nurse.  Thank you for allowing [place] to provide for your healthcare needs today.  ACTIVITY:  You should plan to take it easy for the rest of today and you should NOT DRIVE or use heavy machinery until tomorrow (because of the sedation medicines used during the test).    FOLLOW UP: Our staff will call the number listed on your records 48-72 hours following your procedure to check on you and address any questions or concerns that you may have regarding the information given to you following your procedure. If we do not reach you, we will leave a message.  We will attempt to reach you two times.  During this call, we will ask if you have developed any symptoms of COVID 19. If you develop any symptoms (ie: fever, flu-like symptoms, shortness of breath, cough etc.) before then, please call [phone].  If you test positive for Covid 19 in the 2 weeks post procedure, please call and report this information to [place].    If any biopsies were taken you will be contacted by phone or by letter within the next 1-3 weeks.  Please call us at [phone] if you have not heard about the biopsies in 3 weeks.    SIGNATURES/CONFIDENTIALITY: You and/or your care partner have signed paperwork which will be entered into your electronic medical record.  These signatures attest to the fact  that that the information above on your After Visit Summary has been reviewed and is understood.  Full responsibility of the confidentiality of this discharge information lies with you and/or your care-partner.

## 2020-08-05 NOTE — Progress Notes (Signed)
VS IN ADM BY N CAMPBELL,LPN

## 2020-08-07 ENCOUNTER — Telehealth: Payer: Self-pay | Admitting: *Deleted

## 2020-08-07 ENCOUNTER — Telehealth: Payer: Self-pay

## 2020-08-07 NOTE — Telephone Encounter (Signed)
NO ANSWER, MESSAGE LEFT FOR PATIENT. 

## 2020-08-07 NOTE — Telephone Encounter (Signed)
  Follow up Call-  Call back number 08/05/2020  Post procedure Call Back phone  # [phone]  Permission to leave phone message Yes  Some recent data might be hidden     Patient questions:  Do you have a fever, pain , or abdominal swelling? No. Pain Score  0 *  Have you tolerated food without any problems? Yes.    Have you been able to return to your normal activities? Yes.    Do you have any questions about your discharge instructions: Diet   No. Medications  No. Follow up visit  No.  Do you have questions or concerns about your Care? No.  Actions: * If pain score is 4 or above: No action needed, pain <4.  1. Have you developed a fever since your procedure? no  2.   Have you had an respiratory symptoms (SOB or cough) since your procedure? no  3.   Have you tested positive for COVID 19 since your procedure no  4.   Have you had any family members/close contacts diagnosed with the COVID 19 since your procedure?  no   If yes to any of these questions please route to Joylene John, RN and Joella Prince, RN

## 2020-08-14 ENCOUNTER — Encounter: Payer: Self-pay | Admitting: Gastroenterology

## 2020-09-09 ENCOUNTER — Other Ambulatory Visit (HOSPITAL_COMMUNITY): Payer: Self-pay

## 2020-09-09 MED FILL — Rosuvastatin Calcium Tab 20 MG: ORAL | 90 days supply | Qty: 90 | Fill #0 | Status: AC

## 2020-09-30 ENCOUNTER — Encounter: Payer: 59 | Admitting: Gastroenterology

## 2020-12-24 ENCOUNTER — Other Ambulatory Visit (HOSPITAL_COMMUNITY): Payer: Self-pay

## 2020-12-24 MED FILL — Rosuvastatin Calcium Tab 20 MG: ORAL | 90 days supply | Qty: 90 | Fill #1 | Status: AC

## 2020-12-25 ENCOUNTER — Other Ambulatory Visit (HOSPITAL_COMMUNITY): Payer: Self-pay

## 2021-01-18 NOTE — Progress Notes (Signed)
Phone [phone] In person visit   Subjective:   John Waters is a 45 y.o. year old very pleasant male patient who presents for/with See problem oriented charting Chief Complaint  Patient presents with   Adenopathy   This visit occurred during the SARS-CoV-2 public health emergency.  Safety protocols were in place, including screening questions prior to the visit, additional usage of staff PPE, and extensive cleaning of exam room while observing appropriate contact time as indicated for disinfecting solutions.   Past Medical History-  Patient Active Problem List   Diagnosis Date Noted   Hyperlipidemia     Priority: 2.   Lateral epicondylitis of right elbow 04/01/2015    Priority: 3.   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    Medications- reviewed and updated Current Outpatient Medications  Medication Sig Dispense Refill   acetaminophen (TYLENOL) 500 MG tablet Take 500 mg by mouth every 6 (six) hours as needed for mild pain.     rosuvastatin (CRESTOR) 20 MG tablet TAKE 1 TABLET (20 MG TOTAL) BY MOUTH DAILY. 90 tablet 3   Current Facility-Administered Medications  Medication Dose Route Frequency Provider Last Rate Last Admin   0.9 %  sodium chloride infusion  500 mL Intravenous Once Mansouraty, Telford Nab., MD         Objective:  BP 120/84   Pulse (!) 58   Temp 98.5 F (36.9 C)   Ht 6' 3.51" (1.918 m)   Wt 186 lb (84.4 kg)   SpO2 98%   BMI 22.93 kg/m  Gen: NAD, resting comfortably 1 cm left posterior cervical lymphadenopathy.  No other lymphadenopathy noted CV: RRR no murmurs rubs or gallops Lungs: CTAB no crackles, wheeze, rhonchi Abdomen: soft/nontender/nondistended/normal bowel sounds. No rebound or guarding.  Ext: no edema Skin: warm, dry, mild irritation of skin along neck line where hair is trimmed- one slight scab along central lower portion of neck    Assessment  and Plan   #Posterior cervical lymphadenopathy S:2 months ago noted small lymph node in left posterior neck- did a course of doxycycline (was asymptomatic for most part but wanted to see if possible sinusitis - some minor sniffles-or skin infection). Lymph node Has not enlarged at all since first noted. Has not noted other lesions. Had a friend down in [place] with hodgkin lymphoma which caused him some concern - no recent labs other than march -had a flu shot AFTER, covid shot many months before- maybe may  No fever, chills, intentional weight loss- was trying to get back under 190, no fatigue. No ear pain or sore throat. Intermittent mild runny nose. Had some mild sores in back of scalp but was probably just a scratch- months ago. No cough. No SOB A/P: Posterior cervical lymphadenopathy (does not have look or feel of cyst) that is persistent for 2 months--does get some slight skin irritation around neck line And that could be a potential source but overall this appears somewhat mild and did not improve on doxycycline but I think this is less likely-Size is encouraging at right at 1 cm but  I would like to get ENTs expert opinion and see if they would recommend biopsy given persistence.   -get CBC diff today as well  #hyperlipidemia S: Medication:Rosuvastatin 20 mg daily- more consistent Lab Results  Component Value Date   CHOL 144 06/04/2020   HDL 51.20 06/04/2020   [place] 63  06/04/2020   TRIG 150.0 (H) 06/04/2020   CHOLHDL 3 06/04/2020   A/P: doing well- well controlled- continue current meds   Health Maintenance Due  Topic Date Due   COVID-19 Vaccine (1) team will log Never done   INFLUENZA VACCINE - team will log  10/26/2020   Recommended follow up: Return for as needed for new, worsening, persistent symptoms. Future Appointments  Date Time Provider [place]  06/10/2021  4:00 PM Marin Olp, MD [facility]    Lab/Order associations:   ICD-10-CM   1. Posterior  cervical lymphadenopathy  R59.0 Ambulatory referral to ENT    CBC with Differential/Platelet    2. Hyperlipidemia, unspecified hyperlipidemia type  E78.5     3. Need for immunization against influenza  Z23       No orders of the defined types were placed in this encounter.   I,Jada Bradford,acting as a scribe for [name], MD.,have documented all relevant documentation on the behalf of [name], MD,as directed by  [name], MD while in the presence of [name], MD.  I, [name], MD, have reviewed all documentation for this visit. The documentation on 01/20/21 for the exam, diagnosis, procedures, and orders are all accurate and complete.  Return precautions advised.  [name], MD

## 2021-01-20 ENCOUNTER — Ambulatory Visit: Payer: 59 | Admitting: Family Medicine

## 2021-01-20 ENCOUNTER — Other Ambulatory Visit: Payer: Self-pay

## 2021-01-20 ENCOUNTER — Encounter: Payer: Self-pay | Admitting: Family Medicine

## 2021-01-20 VITALS — BP 120/84 | HR 58 | Temp 98.5°F | Ht 75.51 in | Wt 186.0 lb

## 2021-01-20 DIAGNOSIS — E785 Hyperlipidemia, unspecified: Secondary | ICD-10-CM | POA: Diagnosis not present

## 2021-01-20 DIAGNOSIS — Z23 Encounter for immunization: Secondary | ICD-10-CM | POA: Diagnosis not present

## 2021-01-20 DIAGNOSIS — R590 Localized enlarged lymph nodes: Secondary | ICD-10-CM | POA: Diagnosis not present

## 2021-01-20 DIAGNOSIS — R599 Enlarged lymph nodes, unspecified: Secondary | ICD-10-CM

## 2021-01-20 LAB — CBC WITH DIFFERENTIAL/PLATELET
Basophils Absolute: 0 10*3/uL (ref 0.0–0.1)
Basophils Relative: 0.5 % (ref 0.0–3.0)
Eosinophils Absolute: 0.1 10*3/uL (ref 0.0–0.7)
Eosinophils Relative: 1.4 % (ref 0.0–5.0)
HCT: 44.1 % (ref 39.0–52.0)
Hemoglobin: 15 g/dL (ref 13.0–17.0)
Lymphocytes Relative: 36 % (ref 12.0–46.0)
Lymphs Abs: 1.6 10*3/uL (ref 0.7–4.0)
MCHC: 34 g/dL (ref 30.0–36.0)
MCV: 87 fl (ref 78.0–100.0)
Monocytes Absolute: 0.4 10*3/uL (ref 0.1–1.0)
Monocytes Relative: 9 % (ref 3.0–12.0)
Neutro Abs: 2.3 10*3/uL (ref 1.4–7.7)
Neutrophils Relative %: 53.1 % (ref 43.0–77.0)
Platelets: 243 10*3/uL (ref 150.0–400.0)
RBC: 5.06 Mil/uL (ref 4.22–5.81)
RDW: 13.8 % (ref 11.5–15.5)
WBC: 4.4 10*3/uL (ref 4.0–10.5)

## 2021-01-20 NOTE — Patient Instructions (Addendum)
Team please log in COVID and Flu shots.   We will call you within two weeks about your referral to Ear,Nose and Throat. If you do not hear within 2 weeks, give [place] a call.   Recommended follow up: Return for as needed for new, worsening, persistent symptoms. Otherwise keep march physical

## 2021-02-08 DIAGNOSIS — R590 Localized enlarged lymph nodes: Secondary | ICD-10-CM | POA: Diagnosis not present

## 2021-04-05 ENCOUNTER — Other Ambulatory Visit (HOSPITAL_COMMUNITY): Payer: Self-pay

## 2021-04-05 MED FILL — Rosuvastatin Calcium Tab 20 MG: ORAL | 90 days supply | Qty: 90 | Fill #2 | Status: AC

## 2021-06-10 ENCOUNTER — Encounter: Payer: 59 | Admitting: Family Medicine

## 2021-07-04 ENCOUNTER — Other Ambulatory Visit: Payer: Self-pay | Admitting: Family Medicine

## 2021-07-04 MED ORDER — CIPROFLOXACIN HCL 0.3 % OP SOLN: 1.0000 [drp] | OPHTHALMIC | 0 refills | Status: DC

## 2021-07-04 NOTE — Progress Notes (Signed)
Pt with eye irritation, contact lens wearer  ?

## 2021-07-09 ENCOUNTER — Encounter: Payer: Self-pay | Admitting: Family Medicine

## 2021-07-20 ENCOUNTER — Other Ambulatory Visit (HOSPITAL_COMMUNITY): Payer: Self-pay

## 2021-07-20 ENCOUNTER — Other Ambulatory Visit: Payer: Self-pay | Admitting: Family Medicine

## 2021-07-20 MED ORDER — ROSUVASTATIN CALCIUM 20 MG PO TABS
20.0000 mg | ORAL_TABLET | Freq: Every day | ORAL | 3 refills | Status: DC
  Filled 2021-07-20: qty 90, 90d supply, fill #0
  Filled 2021-11-05: qty 90, 90d supply, fill #1
  Filled 2022-02-08: qty 90, 90d supply, fill #2
  Filled 2022-05-23: qty 90, 90d supply, fill #3

## 2021-08-05 ENCOUNTER — Encounter: Payer: Self-pay | Admitting: Family Medicine

## 2021-08-05 ENCOUNTER — Ambulatory Visit (INDEPENDENT_AMBULATORY_CARE_PROVIDER_SITE_OTHER): Payer: 59 | Admitting: Family Medicine

## 2021-08-05 VITALS — BP 108/64 | HR 54 | Temp 98.3°F | Ht 75.0 in | Wt 191.8 lb

## 2021-08-05 DIAGNOSIS — E785 Hyperlipidemia, unspecified: Secondary | ICD-10-CM

## 2021-08-05 DIAGNOSIS — Z0000 Encounter for general adult medical examination without abnormal findings: Secondary | ICD-10-CM | POA: Diagnosis not present

## 2021-08-05 NOTE — Patient Instructions (Addendum)
Thanks for doing labs ?If you have mychart- we will send your results within 3 business days of [place] receiving them.  ?If you do not have mychart- we will call you about results within 5 business days of [place] receiving them.  ?*please also note that you will see labs on mychart as soon as they post. I will later go in and write notes on them- will say "notes from Dr. Yong Channel"  ? ?Recommended follow up: Return in about 1 year (around 08/06/2022) for physical or sooner if needed.Schedule b4 you leave. ?

## 2021-08-05 NOTE — Progress Notes (Addendum)
?Phone: [phone] ? ?  ?Subjective:  ?Patient presents today for their annual physical. Chief complaint-noted.  ? ?See problem oriented charting- ?ROS- full  review of systems was completed and negative  ?except for: muscle aches ? ?The following were reviewed and entered/updated in epic: ?Past Medical History:  ?Diagnosis Date  ? Hyperlipidemia   ? likely crestor '5mg'$ . LDL 170 in 2016 and strong family history CAD in mom and dad  ? UTI (urinary tract infection)   ? ER visit. E. Coli. Saw urology- no further workup.   ? ?Patient Active Problem List  ? Diagnosis Date Noted  ? Hyperlipidemia   ?  Priority: Medium   ? Lateral epicondylitis of right elbow 04/01/2015  ?  Priority: Low  ? 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  Priority: Low  ? 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  Priority: Low  ? ?Past Surgical History:  ?Procedure Laterality Date  ? VASECTOMY    ? ? ?Family History  ?Problem Relation Age of Onset  ? Heart disease Mother   ?     52s. also mitral valve repair  ? Hyperlipidemia Mother   ? Breast cancer Mother 37  ? Heart disease Father   ? Hyperlipidemia Father   ? Healthy Sister   ? Breast cancer Maternal Grandmother   ? Lung cancer Maternal Grandfather   ? Breast cancer Paternal Grandmother   ? Brain cancer Paternal Grandfather   ? Colon cancer Maternal Uncle 55  ? Pancreatic cancer Maternal Uncle   ?     died 18  ? Colon polyps Neg Hx   ? Esophageal cancer Neg Hx   ? Stomach cancer Neg Hx   ? Rectal cancer Neg Hx   ? ? ?Medications- reviewed and updated ?Current Outpatient Medications  ?Medication Sig Dispense Refill  ? acetaminophen (TYLENOL) 500 MG tablet Take 500 mg by mouth every 6 (six) hours as needed for mild pain.    ? rosuvastatin (CRESTOR) 20 MG tablet Take 1 tablet (20 mg total) by mouth daily. 90 tablet 3  ? ?Current Facility-Administered Medications  ?Medication Dose Route Frequency Provider Last Rate Last Admin  ? 0.9 %  sodium chloride infusion  500 mL Intravenous Once  Mansouraty, Telford Nab., MD      ? ? ?Allergies-reviewed and updated ?No Known Allergies ? ?Social History  ? ?Social History Narrative  ? 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 3 kids 9,7,5 go to [employer] school 08/2016.   ?   ? 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   ?   ? Enjoys exercising, time with family  ? ? ?  ?Objective:  ?BP 108/64   Pulse (!) 54   Temp 98.3 ?F (36.8 ?C)   Ht '6\' 3"'$  (1.905 m)   Wt 191 lb 12.8 oz (87 kg)   SpO2 98%   BMI 23.97 kg/m?  ?Gen: NAD, resting comfortably ?HEENT: Mucous membranes are moist. Oropharynx normal ?Neck: no thyromegaly ?CV: RRR no murmurs rubs or gallops ?Lungs: CTAB no crackles, wheeze, rhonchi ?Abdomen: soft/nontender/nondistended/normal bowel sounds. No rebound or guarding.  ?Ext: no edema ?Skin: warm, dry ?Neuro: grossly normal, moves all extremities, PERRLA ?   ?Assessment and Plan:  ?46 y.o. male presenting for annual physical.  ?Health Maintenance counseling: ?1. Anticipatory guidance: Patient counseled regarding regular dental exams -q6 months, eye exams - yearly [facility] vision,  avoiding smoking and second hand smoke , limiting  alcohol to 2 beverages per day, no illicit drugs.   ?2. Risk factor reduction:  Advised patient of need for regular exercise and diet rich and fruits and vegetables to reduce risk of heart attack and stroke.  ?Exercise- 4-5 days a week.  ?Diet/weight management-weight down 3 pounds from last year. Reasonably balanced diet.  ?Wt Readings from Last 3 Encounters:  ?08/05/21 191 lb 12.8 oz (87 kg)  ?01/20/21 186 lb (84.4 kg)  ?08/05/20 195 lb (88.5 kg)  ?3. Immunizations/screenings/ancillary studies-up-to-date other than bivalent booster ?Immunization History  ?Administered Date(s) Administered  ? Influenza,inj,Quad PF,6+ Mos 01/12/2021  ? Influenza-Unspecified 12/27/2019  ? PFIZER Comirnaty(Gray Top)Covid-19 Tri-Sucrose Vaccine 03/20/2019, 04/08/2019,  12/04/2019, 08/13/2020  ? Tdap 09/23/2016  ?4. Prostate cancer screening-  no family history, start at age 65  ? 51. 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 Skin cancer screening/prevention-has seen Dr. Ronnald Ramp of [facility] dermatology in the past- no recent issues. advised regular sunscreen use. Denies worrisome, changing, or new skin lesions.  ?7. Testicular cancer screening- advised monthly self exams  ?8. STD screening- patient opts out as monogamous ?9. Smoking associated screening-never smoker ? ?Status of chronic or acute concerns  ? ?#hyperlipidemia ?S: Medication:rosuvastatin 20 mg  ?Lab Results  ?Component Value Date  ? CHOL 144 06/04/2020  ? HDL 51.20 06/04/2020  ? [place] 63 06/04/2020  ? TRIG 150.0 (H) 06/04/2020  ? CHOLHDL 3 06/04/2020  ? A/P: hopefully controlled- update lipids. Does have family history CAD ? ?#Posterior cervical lymphadenopathy-had isolated lymph node 01/20/2021 visit-was seen by ENT in November by Dr. Redmond Baseman and thought to be reactive and to monitor only unless worsens ? ?Recommended follow up: Return in about 1 year (around 08/06/2022) for physical or sooner if needed.Schedule b4 you leave. ? ?Lab/Order associations:NOT fasting- in reference to blood sugar and possibly triglycerides- PF changs ?  ICD-10-CM   ?1. Preventative health care  Z00.00 CBC with Differential/Platelet  ?  Comprehensive metabolic panel  ?  Lipid panel  ?  ?2. Hyperlipidemia, unspecified hyperlipidemia type  E78.5 CBC with Differential/Platelet  ?  Comprehensive metabolic panel  ?  Lipid panel  ?  ? ? ?No orders of the defined types were placed in this encounter. ? ? ?Return precautions advised.  ? ?[name], MD ? ? ?

## 2021-08-06 LAB — LIPID PANEL
Cholesterol: 157 mg/dL (ref 0–200)
HDL: 56 mg/dL (ref >39.00)
LDL Cholesterol: 81 mg/dL (ref 0–99)
NonHDL: 100.87
Total CHOL/HDL Ratio: 3
Triglycerides: 97 mg/dL (ref 0.0–149.0)
VLDL: 19.4 mg/dL (ref 0.0–40.0)

## 2021-08-06 LAB — CBC WITH DIFFERENTIAL/PLATELET
Basophils Absolute: 0 10*3/uL (ref 0.0–0.1)
Basophils Relative: 1 % (ref 0.0–3.0)
Eosinophils Absolute: 0.1 10*3/uL (ref 0.0–0.7)
Eosinophils Relative: 2.2 % (ref 0.0–5.0)
HCT: 41.7 % (ref 39.0–52.0)
Hemoglobin: 14.2 g/dL (ref 13.0–17.0)
Lymphocytes Relative: 41.2 % (ref 12.0–46.0)
Lymphs Abs: 2 10*3/uL (ref 0.7–4.0)
MCHC: 34.1 g/dL (ref 30.0–36.0)
MCV: 87.7 fl (ref 78.0–100.0)
Monocytes Absolute: 0.4 10*3/uL (ref 0.1–1.0)
Monocytes Relative: 8.9 % (ref 3.0–12.0)
Neutro Abs: 2.2 10*3/uL (ref 1.4–7.7)
Neutrophils Relative %: 46.7 % (ref 43.0–77.0)
Platelets: 235 10*3/uL (ref 150.0–400.0)
RBC: 4.75 Mil/uL (ref 4.22–5.81)
RDW: 13.7 % (ref 11.5–15.5)
WBC: 4.8 10*3/uL (ref 4.0–10.5)

## 2021-08-06 LAB — COMPREHENSIVE METABOLIC PANEL
ALT: 19 U/L (ref 0–53)
AST: 21 U/L (ref 0–37)
Albumin: 4.3 g/dL (ref 3.5–5.2)
Alkaline Phosphatase: 47 U/L (ref 39–117)
BUN: 19 mg/dL (ref 6–23)
CO2: 28 mEq/L (ref 19–32)
Calcium: 9.1 mg/dL (ref 8.4–10.5)
Chloride: 103 mEq/L (ref 96–112)
Creatinine, Ser: 1.13 mg/dL (ref 0.40–1.50)
GFR: 78.16 mL/min (ref >60.00)
Glucose, Bld: 86 mg/dL (ref 70–99)
Potassium: 4 mEq/L (ref 3.5–5.1)
Sodium: 139 mEq/L (ref 135–145)
Total Bilirubin: 0.4 mg/dL (ref 0.2–1.2)
Total Protein: 6.7 g/dL (ref 6.0–8.3)

## 2021-11-05 ENCOUNTER — Other Ambulatory Visit (HOSPITAL_COMMUNITY): Payer: Self-pay

## 2021-12-07 ENCOUNTER — Other Ambulatory Visit (HOSPITAL_COMMUNITY): Payer: Self-pay

## 2021-12-30 ENCOUNTER — Other Ambulatory Visit (HOSPITAL_COMMUNITY): Payer: Self-pay

## 2022-01-03 ENCOUNTER — Other Ambulatory Visit (HOSPITAL_COMMUNITY): Payer: Self-pay

## 2022-02-08 ENCOUNTER — Other Ambulatory Visit (HOSPITAL_COMMUNITY): Payer: Self-pay

## 2022-05-23 ENCOUNTER — Other Ambulatory Visit (HOSPITAL_COMMUNITY): Payer: Self-pay

## 2022-08-29 ENCOUNTER — Other Ambulatory Visit (HOSPITAL_COMMUNITY): Payer: Self-pay

## 2022-08-29 ENCOUNTER — Other Ambulatory Visit: Payer: Self-pay | Admitting: Family Medicine

## 2022-08-30 ENCOUNTER — Other Ambulatory Visit: Payer: Self-pay | Admitting: Family Medicine

## 2022-08-30 ENCOUNTER — Other Ambulatory Visit (HOSPITAL_COMMUNITY): Payer: Self-pay

## 2022-08-30 MED ORDER — ROSUVASTATIN CALCIUM 20 MG PO TABS
20.0000 mg | ORAL_TABLET | Freq: Every day | ORAL | 3 refills | Status: DC
  Filled 2022-08-30: qty 90, 90d supply, fill #0

## 2022-10-18 ENCOUNTER — Encounter: Payer: Self-pay | Admitting: Family Medicine

## 2022-10-18 ENCOUNTER — Ambulatory Visit (INDEPENDENT_AMBULATORY_CARE_PROVIDER_SITE_OTHER): Payer: Commercial Managed Care - PPO | Admitting: Family Medicine

## 2022-10-18 VITALS — BP 128/64 | HR 65 | Temp 98.2°F | Ht 75.0 in | Wt 196.0 lb

## 2022-10-18 DIAGNOSIS — E785 Hyperlipidemia, unspecified: Secondary | ICD-10-CM

## 2022-10-18 DIAGNOSIS — Z0000 Encounter for general adult medical examination without abnormal findings: Secondary | ICD-10-CM | POA: Diagnosis not present

## 2022-10-18 NOTE — Patient Instructions (Addendum)
Team please print labs   Recommended follow up: Return in about 1 year (around 10/18/2023) for physical or sooner if needed.Schedule b4 you leave.

## 2022-10-18 NOTE — Progress Notes (Signed)
Phone: [phone]    Subjective:  Patient presents today for their annual physical. Chief complaint-noted.   See problem oriented charting- ROS- full  review of systems was completed and negative  except for: having to use readers  The following were reviewed and entered/updated in epic: Past Medical History:  Diagnosis Date   Hyperlipidemia    likely crestor 5mg . LDL 170 in 2016 and strong family history CAD in mom and dad   UTI (urinary tract infection)    ER visit. E. Coli. Saw urology- no further workup.    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 also mitral valve repair   Hyperlipidemia Mother    Breast cancer Mother 48   Heart disease Father    Hyperlipidemia Father    Healthy Sister    Breast cancer Maternal Grandmother    Lung cancer Maternal Grandfather    Breast cancer Paternal Grandmother    Brain cancer Paternal Grandfather    Colon cancer Maternal Uncle 61   Pancreatic cancer Maternal Uncle        died 92   Colon polyps Neg Hx    Esophageal cancer Neg Hx    Stomach cancer Neg Hx    Rectal cancer Neg Hx     Medications- reviewed and updated Current Outpatient Medications  Medication Sig Dispense Refill   acetaminophen (TYLENOL) 500 MG tablet Take 500 mg by mouth every 6 (six) hours as needed for mild pain.     rosuvastatin (CRESTOR) 20 MG tablet Take 1 tablet (20 mg total) by mouth daily. 90 tablet 3   No current facility-administered medications for this visit.    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 3 kids 9,7,5 go to [employer] [place] school 08/2016.       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       Enjoys exercising, time with family      Objective:  BP 128/64   Pulse 65   Temp 98.2 F (36.8 C)   Ht 6\' 3"  (1.905 m)   Wt 196 lb (88.9 kg)   SpO2 98%   BMI 24.50 kg/m  Gen: NAD, resting comfortably HEENT: Mucous membranes are moist. Oropharynx normal Neck: no thyromegaly CV: RRR no murmurs rubs or gallops Lungs: CTAB no crackles, wheeze, rhonchi Abdomen: soft/nontender/nondistended/normal bowel sounds. No rebound or guarding.  Ext: no edema Skin: warm, dry Neuro: grossly normal, moves all extremities, PERRLA     Assessment and Plan:  47 y.o. male presenting for annual physical.  Health Maintenance counseling: 1. Anticipatory guidance: Patient counseled regarding regular dental exams q6 months, eye exams -yearly [facility],  avoiding smoking and second hand smoke , limiting alcohol to 2 beverages per day, no illicit drugs.   2. Risk factor reduction:  Advised patient of need for regular exercise and diet rich and fruits  and vegetables to reduce risk of heart attack and stroke.  Exercise- 4-5 days a week stable.  Diet/weight management-weight within 5 lbs of last year- good muscle mass and reasonably healthy diet .  Wt Readings from Last 3 Encounters:  10/18/22 196 lb (88.9 kg)  08/05/21 191 lb 12.8 oz (87 kg)  01/20/21 186 lb (84.4 kg)  3. Immunizations/screenings/ancillary studies- will get fall flu shot- opts out of COVID shot  Immunization History  Administered Date(s) Administered   Influenza,inj,Quad PF,6+ Mos 01/12/2021   Influenza-Unspecified 12/27/2019   PFIZER Comirnaty(Gray Top)Covid-19 Tri-Sucrose Vaccine 03/20/2019, 04/08/2019, 12/04/2019, 08/13/2020   Tdap 09/23/2016  4. Prostate cancer screening-  no family history, start at  age 55   5. 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 Skin cancer screening/prevention- Dr. Yetta Barre of [facility] dermatology in past- no recent issues. advised regular sunscreen use. Denies worrisome, changing, or new skin lesions.  7. Testicular cancer screening- advised monthly self exams  8. STD screening- patient opts out- only active with wife 9. Smoking associated screening- never smoker  Status of chronic or acute concerns   #hyperlipidemia with family history CAD S: Medication:rosuvastatin 20 mg  Lab Results  Component Value Date   CHOL 157 08/05/2021   HDL 56.00 08/05/2021   LDLCALC 81 08/05/2021   TRIG 97.0 08/05/2021   CHOLHDL 3 08/05/2021   A/P: reasonable control- continue current medications and update lipids to confirm stability or improvement  Recommended follow up: Return in about 1 year (around 10/18/2023) for physical or sooner if needed.Schedule b4 you leave.  Lab/Order associations: fasting- will do labs at his clinic and have them sent to [place]   ICD-10-CM   1. Routine general medical examination at a health care facility  Z00.00 Comprehensive metabolic panel    CBC with Differential/Platelet    Lipid panel    2. Hyperlipidemia, unspecified hyperlipidemia type  E78.5 Comprehensive metabolic panel    CBC with Differential/Platelet    Lipid panel     No orders of the defined types were placed in this encounter.  Return precautions advised.  Tana Conch, MD

## 2022-10-19 ENCOUNTER — Other Ambulatory Visit: Payer: Self-pay

## 2022-10-19 ENCOUNTER — Other Ambulatory Visit (HOSPITAL_COMMUNITY): Payer: Self-pay

## 2022-10-19 ENCOUNTER — Other Ambulatory Visit (INDEPENDENT_AMBULATORY_CARE_PROVIDER_SITE_OTHER): Payer: Commercial Managed Care - PPO

## 2022-10-19 DIAGNOSIS — E785 Hyperlipidemia, unspecified: Secondary | ICD-10-CM

## 2022-10-19 DIAGNOSIS — Z0000 Encounter for general adult medical examination without abnormal findings: Secondary | ICD-10-CM

## 2022-10-19 LAB — COMPREHENSIVE METABOLIC PANEL
ALT: 15 U/L (ref 0–53)
AST: 19 U/L (ref 0–37)
Albumin: 4.4 g/dL (ref 3.5–5.2)
Alkaline Phosphatase: 59 U/L (ref 39–117)
BUN: 18 mg/dL (ref 6–23)
CO2: 27 mEq/L (ref 19–32)
Calcium: 9.6 mg/dL (ref 8.4–10.5)
Chloride: 102 mEq/L (ref 96–112)
Creatinine, Ser: 1.2 mg/dL (ref 0.40–1.50)
GFR: 72.11 mL/min (ref >60.00)
Glucose, Bld: 70 mg/dL (ref 70–99)
Potassium: 4.4 mEq/L (ref 3.5–5.1)
Sodium: 138 mEq/L (ref 135–145)
Total Bilirubin: 0.6 mg/dL (ref 0.2–1.2)
Total Protein: 7.1 g/dL (ref 6.0–8.3)

## 2022-10-19 LAB — CBC WITH DIFFERENTIAL/PLATELET
Basophils Absolute: 0.1 10*3/uL (ref 0.0–0.1)
Basophils Relative: 1 % (ref 0.0–3.0)
Eosinophils Absolute: 0.1 10*3/uL (ref 0.0–0.7)
Eosinophils Relative: 2 % (ref 0.0–5.0)
HCT: 47.9 % (ref 39.0–52.0)
Hemoglobin: 15.8 g/dL (ref 13.0–17.0)
Lymphocytes Relative: 32.9 % (ref 12.0–46.0)
Lymphs Abs: 1.9 10*3/uL (ref 0.7–4.0)
MCHC: 33 g/dL (ref 30.0–36.0)
MCV: 89.1 fl (ref 78.0–100.0)
Monocytes Absolute: 0.6 10*3/uL (ref 0.1–1.0)
Monocytes Relative: 10.4 % (ref 3.0–12.0)
Neutro Abs: 3.1 10*3/uL (ref 1.4–7.7)
Neutrophils Relative %: 53.7 % (ref 43.0–77.0)
Platelets: 244 10*3/uL (ref 150.0–400.0)
RBC: 5.37 Mil/uL (ref 4.22–5.81)
RDW: 13.8 % (ref 11.5–15.5)
WBC: 5.7 10*3/uL (ref 4.0–10.5)

## 2022-10-19 LAB — LIPID PANEL
Cholesterol: 164 mg/dL (ref 0–200)
HDL: 57.2 mg/dL (ref >39.00)
LDL Cholesterol: 95 mg/dL (ref 0–99)
NonHDL: 106.47
Total CHOL/HDL Ratio: 3
Triglycerides: 59 mg/dL (ref 0.0–149.0)
VLDL: 11.8 mg/dL (ref 0.0–40.0)

## 2022-10-26 ENCOUNTER — Other Ambulatory Visit: Payer: Self-pay | Admitting: Family Medicine

## 2022-10-26 ENCOUNTER — Other Ambulatory Visit (HOSPITAL_COMMUNITY): Payer: Self-pay

## 2022-10-26 MED ORDER — ROSUVASTATIN CALCIUM 40 MG PO TABS
40.0000 mg | ORAL_TABLET | Freq: Every day | ORAL | 3 refills | Status: DC
Start: 2022-10-26 — End: 2023-12-08
  Filled 2022-10-26 – 2022-11-17 (×2): qty 90, 90d supply, fill #0
  Filled 2023-02-24: qty 90, 90d supply, fill #1
  Filled 2023-05-30: qty 90, 90d supply, fill #2
  Filled 2023-09-04: qty 90, 90d supply, fill #3

## 2022-11-07 ENCOUNTER — Other Ambulatory Visit (HOSPITAL_COMMUNITY): Payer: Self-pay

## 2022-11-17 ENCOUNTER — Other Ambulatory Visit (HOSPITAL_COMMUNITY): Payer: Self-pay

## 2023-01-11 DIAGNOSIS — D692 Other nonthrombocytopenic purpura: Secondary | ICD-10-CM | POA: Diagnosis not present

## 2023-01-11 DIAGNOSIS — L578 Other skin changes due to chronic exposure to nonionizing radiation: Secondary | ICD-10-CM | POA: Diagnosis not present

## 2023-01-11 DIAGNOSIS — D225 Melanocytic nevi of trunk: Secondary | ICD-10-CM | POA: Diagnosis not present

## 2023-01-11 DIAGNOSIS — L821 Other seborrheic keratosis: Secondary | ICD-10-CM | POA: Diagnosis not present

## 2023-01-11 DIAGNOSIS — L814 Other melanin hyperpigmentation: Secondary | ICD-10-CM | POA: Diagnosis not present

## 2023-02-24 ENCOUNTER — Other Ambulatory Visit (HOSPITAL_COMMUNITY): Payer: Self-pay

## 2023-05-30 ENCOUNTER — Other Ambulatory Visit (HOSPITAL_COMMUNITY): Payer: Self-pay

## 2023-09-04 ENCOUNTER — Other Ambulatory Visit (HOSPITAL_COMMUNITY): Payer: Self-pay

## 2023-10-20 ENCOUNTER — Encounter: Payer: Commercial Managed Care - PPO | Admitting: Family Medicine

## 2023-12-08 ENCOUNTER — Other Ambulatory Visit (HOSPITAL_COMMUNITY): Payer: Self-pay

## 2023-12-08 ENCOUNTER — Other Ambulatory Visit: Payer: Self-pay | Admitting: Family Medicine

## 2023-12-08 MED ORDER — ROSUVASTATIN CALCIUM 40 MG PO TABS
40.0000 mg | ORAL_TABLET | Freq: Every day | ORAL | 3 refills | Status: AC
  Filled 2023-12-08 (×2): qty 90, 90d supply, fill #0
  Filled 2024-03-25: qty 90, 90d supply, fill #1

## 2024-01-25 ENCOUNTER — Ambulatory Visit: Payer: Self-pay | Admitting: Family Medicine

## 2024-01-25 ENCOUNTER — Ambulatory Visit (INDEPENDENT_AMBULATORY_CARE_PROVIDER_SITE_OTHER): Admitting: Family Medicine

## 2024-01-25 ENCOUNTER — Encounter: Payer: Self-pay | Admitting: Family Medicine

## 2024-01-25 VITALS — BP 108/72 | HR 57 | Temp 97.9°F | Ht 75.0 in | Wt 189.0 lb

## 2024-01-25 DIAGNOSIS — Z0000 Encounter for general adult medical examination without abnormal findings: Secondary | ICD-10-CM

## 2024-01-25 DIAGNOSIS — E785 Hyperlipidemia, unspecified: Secondary | ICD-10-CM

## 2024-01-25 DIAGNOSIS — D6852 Prothrombin gene mutation: Secondary | ICD-10-CM | POA: Diagnosis not present

## 2024-01-25 LAB — LIPID PANEL
Cholesterol: 155 mg/dL (ref 0–200)
HDL: 59.9 mg/dL (ref >39.00)
LDL Cholesterol: 78 mg/dL (ref 0–99)
NonHDL: 95.43
Total CHOL/HDL Ratio: 3
Triglycerides: 86 mg/dL (ref 0.0–149.0)
VLDL: 17.2 mg/dL (ref 0.0–40.0)

## 2024-01-25 LAB — CBC WITH DIFFERENTIAL/PLATELET
Basophils Absolute: 0 K/uL (ref 0.0–0.1)
Basophils Relative: 0.7 % (ref 0.0–3.0)
Eosinophils Absolute: 0.1 K/uL (ref 0.0–0.7)
Eosinophils Relative: 1.7 % (ref 0.0–5.0)
HCT: 44.5 % (ref 39.0–52.0)
Hemoglobin: 15.1 g/dL (ref 13.0–17.0)
Lymphocytes Relative: 38 % (ref 12.0–46.0)
Lymphs Abs: 1.7 K/uL (ref 0.7–4.0)
MCHC: 34 g/dL (ref 30.0–36.0)
MCV: 86.5 fl (ref 78.0–100.0)
Monocytes Absolute: 0.4 K/uL (ref 0.1–1.0)
Monocytes Relative: 8.7 % (ref 3.0–12.0)
Neutro Abs: 2.2 K/uL (ref 1.4–7.7)
Neutrophils Relative %: 50.9 % (ref 43.0–77.0)
Platelets: 231 K/uL (ref 150.0–400.0)
RBC: 5.14 Mil/uL (ref 4.22–5.81)
RDW: 13.7 % (ref 11.5–15.5)
WBC: 4.4 K/uL (ref 4.0–10.5)

## 2024-01-25 LAB — COMPREHENSIVE METABOLIC PANEL WITH GFR
ALT: 17 U/L (ref 0–53)
AST: 18 U/L (ref 0–37)
Albumin: 4.4 g/dL (ref 3.5–5.2)
Alkaline Phosphatase: 47 U/L (ref 39–117)
BUN: 18 mg/dL (ref 6–23)
CO2: 27 meq/L (ref 19–32)
Calcium: 9.2 mg/dL (ref 8.4–10.5)
Chloride: 103 meq/L (ref 96–112)
Creatinine, Ser: 1.05 mg/dL (ref 0.40–1.50)
GFR: 83.89 mL/min (ref >60.00)
Glucose, Bld: 98 mg/dL (ref 70–99)
Potassium: 4.1 meq/L (ref 3.5–5.1)
Sodium: 137 meq/L (ref 135–145)
Total Bilirubin: 0.6 mg/dL (ref 0.2–1.2)
Total Protein: 6.9 g/dL (ref 6.0–8.3)

## 2024-01-25 NOTE — Progress Notes (Signed)
 Phone: [phone]    Subjective:  Patient presents today for their annual physical. Chief complaint-noted.   See problem oriented charting- ROS- full  review of systems was completed and negative  except for topics noted under acute/chronic concerns  The following were reviewed and entered/updated in epic: Past Medical History:  Diagnosis Date   Hyperlipidemia    likely crestor  5mg . LDL 170 in 2016 and strong family history CAD in mom and dad   UTI (urinary tract infection)    ER visit. E. Coli. Saw urology- no further workup.    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 also mitral valve repair   Hyperlipidemia Mother    Breast cancer Mother 9   Heart disease Father    Hyperlipidemia Father    Healthy Sister    Breast cancer Maternal Grandmother    Lung cancer Maternal Grandfather    Breast cancer Paternal Grandmother    Brain cancer Paternal Grandfather    Colon cancer Maternal Uncle 53   Pancreatic cancer Maternal Uncle        died 7   Colon polyps Neg Hx    Esophageal cancer Neg Hx    Stomach cancer Neg Hx    Rectal cancer Neg Hx     Medications- reviewed and updated Current Outpatient Medications  Medication Sig Dispense Refill   acetaminophen  (TYLENOL ) 500 MG tablet Take 500 mg by mouth every 6 (six) hours as needed for mild pain.     rosuvastatin  (CRESTOR ) 40 MG tablet Take 1 tablet (40 mg total) by mouth daily. 90 tablet 3   No current facility-administered medications for this visit.    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 3 kids 9,7,5 go to [employer] school 08/2016.       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       Enjoys exercising, time with family      Objective:  BP 108/72 (BP Location: Left Arm, Patient Position: Sitting, Cuff Size: Normal)   Pulse (!) 57   Temp 97.9 F (36.6 C) (Temporal)   Ht 6' 3 (1.905 m)   Wt 189 lb (85.7 kg)   SpO2 96%   BMI 23.62 kg/m  Gen: NAD, resting comfortably HEENT: Mucous membranes are moist. Oropharynx normal Neck: no thyromegaly CV: RRR no murmurs rubs or gallops Lungs: CTAB no crackles, wheeze, rhonchi Abdomen: soft/nontender/nondistended/normal bowel sounds. No rebound or guarding.  Ext: no edema Skin: warm, dry Neuro: grossly normal, moves all extremities, PERRLA    Assessment and Plan:  48 y.o. male presenting for annual physical.  Health Maintenance counseling: 1. Anticipatory guidance: Patient counseled regarding regular dental exams -q6 months, eye exams-yearly with [facility],  avoiding smoking and second hand smoke , limiting alcohol to 2 beverages per day- minimal intake, no illicit drugs.   2. Risk factor  reduction:  Advised patient of need for regular exercise and diet rich and fruits and vegetables to reduce risk of heart attack and stroke.  Exercise-4 to 5 days a week still.  Diet/weight management-has maintained healthy weight.  Wt Readings from Last 3 Encounters:  01/25/24 189 lb (85.7 kg)  10/18/22 196 lb (88.9 kg)  08/05/21 191 lb 12.8 oz (87 kg)  3. Immunizations/screenings/ancillary studies-recently had flu shotand otherwise up-to-date Immunization History  Administered Date(s) Administered   Fluzone Influenza virus vaccine,trivalent (IIV3), split virus 01/16/2024   Influenza,inj,Quad PF,6+ Mos 01/12/2021   Influenza-Unspecified 12/27/2019   PFIZER Comirnaty(Gray Top)Covid-19  Tri-Sucrose Vaccine 03/20/2019, 04/08/2019, 12/04/2019, 08/13/2020   Tdap 09/23/2016  4. Prostate cancer screening-  no family history, start at age 11   5. 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  6. Skin cancer screening/prevention-Dr. Joshua of [facility] dermatology in the past-but no recent issues. advised regular sunscreen use. Denies worrisome, changing, or new skin lesions.  7. Testicular cancer screening- advised monthly self exams  8. STD screening- patient opts out-only active with wife 9. Smoking associated screening-never smoker  Status of chronic or acute concerns   #hyperlipidemia with family history of CAD S: Medication: Rosuvastatin  40 mg up from 20 mg last year Lab Results  Component Value Date   CHOL 164 10/19/2022   HDL 57.20 10/19/2022   LDLCALC 95 10/19/2022   TRIG 59.0 10/19/2022   CHOLHDL 3 10/19/2022   A/P: We pushed statin dose up last year and hoping LDL closer to 70-update today. Would be interested in Repatha   #Heel pain- wonders about plantar fasciitis- comes and goes. Does wear inserts and good shoes and trades them out.    Recommended follow up: Return in about 1 year (around 01/24/2025) for physical or sooner if needed.Schedule b4 you leave.  Lab/Order associations: fasting   ICD-10-CM   1. Preventative health care  Z00.00     2. Hyperlipidemia, unspecified hyperlipidemia type  E78.5       No orders of the defined types were placed in this encounter.   Return precautions advised.   Garnette Lukes, MD

## 2024-01-25 NOTE — Patient Instructions (Addendum)
 Please stop by lab before you go If you have mychart- we will send your results within 3 business days of us  receiving them.  If you do not have mychart- we will call you about results within 5 business days of us  receiving them.  *please also note that you will see labs on mychart as soon as they post. I will later go in and write notes on them- will say notes from Dr. Katrinka   Recommended follow up: Return in about 1 year (around 01/24/2025) for physical or sooner if needed.Schedule b4 you leave.

## 2024-01-31 LAB — LIPOPROTEIN A (LPA): Lipoprotein (a): <10 nmol/L (ref <75)

## 2024-03-05 ENCOUNTER — Telehealth: Payer: Self-pay | Admitting: Family Medicine

## 2024-03-05 ENCOUNTER — Other Ambulatory Visit (HOSPITAL_COMMUNITY): Payer: Self-pay

## 2024-03-05 MED ORDER — OSELTAMIVIR PHOSPHATE 75 MG PO CAPS
75.0000 mg | ORAL_CAPSULE | Freq: Every day | ORAL | 0 refills | Status: AC
  Filled 2024-03-05: qty 10, 10d supply, fill #0

## 2024-03-05 NOTE — Telephone Encounter (Signed)
 Two family members with influenza-we are sending a prophylaxis with Tamiflu 

## 2024-03-25 ENCOUNTER — Other Ambulatory Visit (HOSPITAL_COMMUNITY): Payer: Self-pay

## 2024-03-29 ENCOUNTER — Other Ambulatory Visit (HOSPITAL_COMMUNITY): Payer: Self-pay

## 2024-03-30 ENCOUNTER — Other Ambulatory Visit (HOSPITAL_COMMUNITY): Payer: Self-pay

## 2024-04-12 ENCOUNTER — Other Ambulatory Visit (HOSPITAL_COMMUNITY): Payer: Self-pay

## 2025-01-30 ENCOUNTER — Encounter: Admitting: Family Medicine
